# Patient Record
Sex: Female | Born: 1975 | Race: Black or African American | Hispanic: No | Marital: Single | Smoking: Never smoker
Health system: Southern US, Community
[De-identification: ages and names within clinical notes are randomized; demographics above are authoritative.]

## PROBLEM LIST (undated history)

## (undated) DIAGNOSIS — D219 Benign neoplasm of connective and other soft tissue, unspecified: Secondary | ICD-10-CM

## (undated) DIAGNOSIS — I10 Essential (primary) hypertension: Secondary | ICD-10-CM

## (undated) DIAGNOSIS — Z5189 Encounter for other specified aftercare: Secondary | ICD-10-CM

## (undated) DIAGNOSIS — A599 Trichomoniasis, unspecified: Secondary | ICD-10-CM

## (undated) HISTORY — DX: Encounter for other specified aftercare: Z51.89

## (undated) HISTORY — PX: EYE SURGERY: SHX253

## (undated) HISTORY — PX: WISDOM TOOTH EXTRACTION: SHX21

## (undated) HISTORY — DX: Benign neoplasm of connective and other soft tissue, unspecified: D21.9

## (undated) HISTORY — DX: Trichomoniasis, unspecified: A59.9

---

## 2014-05-13 DIAGNOSIS — Z5189 Encounter for other specified aftercare: Secondary | ICD-10-CM

## 2014-05-13 HISTORY — DX: Encounter for other specified aftercare: Z51.89

## 2014-05-24 HISTORY — PX: ABDOMINAL HYSTERECTOMY: SHX81

## 2014-07-25 ENCOUNTER — Ambulatory Visit (INDEPENDENT_AMBULATORY_CARE_PROVIDER_SITE_OTHER): Payer: 59 | Admitting: Physician Assistant

## 2014-07-25 VITALS — BP 150/86 | HR 86 | Temp 98.3°F | Resp 16 | Ht 64.0 in | Wt 171.2 lb

## 2014-07-25 DIAGNOSIS — D259 Leiomyoma of uterus, unspecified: Secondary | ICD-10-CM | POA: Diagnosis not present

## 2014-07-25 DIAGNOSIS — Z0000 Encounter for general adult medical examination without abnormal findings: Secondary | ICD-10-CM

## 2014-07-25 DIAGNOSIS — D219 Benign neoplasm of connective and other soft tissue, unspecified: Secondary | ICD-10-CM | POA: Insufficient documentation

## 2014-07-25 DIAGNOSIS — D251 Intramural leiomyoma of uterus: Secondary | ICD-10-CM | POA: Diagnosis not present

## 2014-07-25 NOTE — Progress Notes (Signed)
Subjective:    Patient ID: Janet Burnett, female    DOB: July 01, 1975, 39 y.o.   MRN: [id number]  Chief Complaint  Patient presents with  . Establish Care   Patient Active Problem List   Diagnosis Date Noted  . Fibroids 07/25/2014   Prior to Admission medications   Not on File   Medications, allergies, past medical history, surgical history, family history, social history and problem list reviewed and updated.  HPI  20 yof with pmh uterine fibroids presents for gyn referral.  Moved here from [facility] yesterday. She has family in [place] and will now be living in the area. She needs to establish care.   PMH - Uterine fibroids. Hx heavy menses. Had syncopal episode 12/15 attributed to anemia from fibroids. Was given 2 units. Has been following with gyn since the episode. Pt states she has been taking 2 iron pills day for past couple months. States gyn in [place] has bene following her anemia. She needs a referral to local gyn for possible hysterectomy. Has two daughters, age 65 and 42. They are still in [place].   Otherwise states she is healthy with no acute issues. Denies any aches, pains, complaints.   BP mildly elevated today. 150/86. Pt states she has hx mildly elevated bp at her appts.   Is not exercising or watching her diet as is too busy with recent move. Not currently working. Quit job in [employer] 6 wks ago, looking for work here now.   Review of Systems No cp, sob, fever, chills, ha, dizziness, lh, syncope, abd pain, n/v, diarrhea.     Objective:   Physical Exam  Constitutional: She is oriented to person, place, and time. She appears well-developed and well-nourished.  Non-toxic appearance. She does not have a sickly appearance. She does not appear ill. No distress.  BP 150/86 mmHg  Pulse 86  Temp(Src) 98.3 F (36.8 C) (Oral)  Resp 16  Ht 5\' 4"  (1.626 m)  Wt 171 lb 4 oz (77.678 kg)  BMI 29.38 kg/m2  SpO2 100%  LMP 07/23/2014   HENT:  Right Ear: Tympanic membrane normal.  Left Ear:  Tympanic membrane normal.  Nose: Nose normal.  Mouth/Throat: Uvula is midline and oropharynx is clear and moist.  Eyes:    Dark discoloration over lateral right cornea. Pt states stable for many yrs, she is unsure of the cause.   Cardiovascular: Normal rate, regular rhythm and normal heart sounds.  Exam reveals no gallop.   No murmur heard. Pulses:      Dorsalis pedis pulses are 2+ on the right side, and 2+ on the left side.       Posterior tibial pulses are 2+ on the right side, and 2+ on the left side.  Pulmonary/Chest: Effort normal and breath sounds normal. She has no decreased breath sounds. She has no wheezes. She has no rhonchi. She has no rales.  Neurological: She is alert and oriented to person, place, and time. She has normal strength. No cranial nerve deficit or sensory deficit.  Psychiatric: She has a normal mood and affect. Her speech is normal.      Assessment & Plan:   17 yof with pmh uterine fibroids presents for gyn referral.  Annual physical exam --not cpe today as pt was here to establish care in order to get gyn referral --states she has been seeing pcp and gyn in [place] and she is up to date on everything --we will plan to obtain records --likely cpe at  next visit --> poss labs, tdap, pap depending on what has been done in [facility] --Used to see: [facility] [employer] [facility] [place], [place]     Drs. Janet Burnett and Janet Burnett  Fibroids, intramural - Plan: Ambulatory referral to Gynecology Uterine leiomyoma, unspecified location --obtaining records from [facility] --referral to gyn for further management  Janet Gutting, PA-C Physician Assistant-Certified [facility]  07/25/2014 9:29 PM

## 2014-07-25 NOTE — Patient Instructions (Signed)
Your exam was normal today. We referred you to obgyn and they will be in contact with you for an appointment.  We'll try to get your records from [facility] so we know what you're up to date on moving forward.  Please let us know if we can do anything else for you.

## 2014-08-06 ENCOUNTER — Encounter: Payer: Self-pay | Admitting: Gynecology

## 2014-08-06 ENCOUNTER — Ambulatory Visit (INDEPENDENT_AMBULATORY_CARE_PROVIDER_SITE_OTHER): Payer: 59 | Admitting: Gynecology

## 2014-08-06 VITALS — BP 136/88 | Ht 63.5 in | Wt 173.0 lb

## 2014-08-06 DIAGNOSIS — D252 Subserosal leiomyoma of uterus: Secondary | ICD-10-CM

## 2014-08-06 DIAGNOSIS — D500 Iron deficiency anemia secondary to blood loss (chronic): Secondary | ICD-10-CM

## 2014-08-06 DIAGNOSIS — Z9289 Personal history of other medical treatment: Secondary | ICD-10-CM | POA: Diagnosis not present

## 2014-08-06 DIAGNOSIS — N946 Dysmenorrhea, unspecified: Secondary | ICD-10-CM

## 2014-08-06 DIAGNOSIS — D251 Intramural leiomyoma of uterus: Secondary | ICD-10-CM | POA: Diagnosis not present

## 2014-08-06 DIAGNOSIS — N920 Excessive and frequent menstruation with regular cycle: Secondary | ICD-10-CM

## 2014-08-06 LAB — CBC WITH DIFFERENTIAL/PLATELET
Basophils Absolute: 0.1 10*3/uL (ref 0.0–0.1)
Basophils Relative: 1 % (ref 0–1)
EOS PCT: 4 % (ref 0–5)
Eosinophils Absolute: 0.2 10*3/uL (ref 0.0–0.7)
HCT: 32.6 % — ABNORMAL LOW (ref 36.0–46.0)
Hemoglobin: 10.1 g/dL — ABNORMAL LOW (ref 12.0–15.0)
LYMPHS ABS: 1.8 10*3/uL (ref 0.7–4.0)
LYMPHS PCT: 31 % (ref 12–46)
MCH: 22.9 pg — AB (ref 26.0–34.0)
MCHC: 31 g/dL (ref 30.0–36.0)
MCV: 73.8 fL — AB (ref 78.0–100.0)
MONO ABS: 0.4 10*3/uL (ref 0.1–1.0)
MPV: 8.5 fL — ABNORMAL LOW (ref 8.6–12.4)
Monocytes Relative: 6 % (ref 3–12)
Neutro Abs: 3.4 10*3/uL (ref 1.7–7.7)
Neutrophils Relative %: 58 % (ref 43–77)
Platelets: 420 10*3/uL — ABNORMAL HIGH (ref 150–400)
RBC: 4.42 MIL/uL (ref 3.87–5.11)
RDW: 20.2 % — ABNORMAL HIGH (ref 11.5–15.5)
WBC: 5.9 10*3/uL (ref 4.0–10.5)

## 2014-08-06 LAB — HEPATITIS C ANTIBODY: HCV AB: NEGATIVE

## 2014-08-06 LAB — HEPATITIS B SURFACE ANTIGEN: HEP B S AG: NEGATIVE

## 2014-08-06 NOTE — Patient Instructions (Signed)
Abdominal Hysterectomy Abdominal hysterectomy is a surgical procedure to remove your womb (uterus). Your uterus is the muscular organ that contains a developing baby. This surgery is done for many reasons. You may need an abdominal hysterectomy if you have cancer, growths (tumors), long-term pain, or bleeding. You may also have this procedure if your uterus has slipped down into your vagina (uterine prolapse). Depending on why you need an abdominal hysterectomy, you may also have other reproductive organs removed. These could include the part of your vagina that connects with your uterus (cervix), the organs that make eggs (ovaries), and the tubes that connect the ovaries to the uterus (fallopian tubes). LET YOUR HEALTH CARE PROVIDER KNOW ABOUT:   Any allergies you have.  All medicines you are taking, including vitamins, herbs, eye drops, creams, and over-the-counter medicines.  Previous problems you or members of your family have had with the use of anesthetics.  Any blood disorders you have.  Previous surgeries you have had.  Medical conditions you have. RISKS AND COMPLICATIONS Generally, this is a safe procedure. However, as with any procedure, problems can occur. Infection is the most common problem after an abdominal hysterectomy. Other possible problems include:  Bleeding.  Formation of blood clots that may break free and travel to your lungs.  Injury to other organs near your uterus.  Nerve injury causing nerve pain.  Decreased interest in sex or pain during sexual intercourse. BEFORE THE PROCEDURE  Abdominal hysterectomy is a major surgical procedure. It can affect the way you feel about yourself. Talk to your health care provider about the physical and emotional changes hysterectomy may cause.  You may need to have blood work and X-rays done before surgery.  Quit smoking if you smoke. Ask your health care provider for help if you are struggling to quit.  Stop taking  medicines that thin your blood as directed by your health care provider.  You may be instructed to take antibiotic medicines or laxatives before surgery.  Do not eat or drink anything for 6-8 hours before surgery.  Take your regular medicines with a small sip of water.  Bathe or shower the night or morning before surgery. PROCEDURE  Abdominal hysterectomy is done in the operating room at the hospital.  In most cases, you will be given a medicine that makes you go to sleep (general anesthetic).  The surgeon will make a cut (incision) through the skin in your lower belly.  The incision may be about 5-7 inches long. It may go side-to-side or up-and-down.  The surgeon will move aside the body tissue that covers your uterus. The surgeon will then carefully take out your uterus along with any of your other reproductive organs that need to be removed.  Bleeding will be controlled with clamps or sutures.  The surgeon will close your incision with sutures or metal clips. AFTER THE PROCEDURE  You will have some pain immediately after the procedure.  You will be given pain medicine in the recovery room.  You will be taken to your hospital room when you have recovered from the anesthesia.  You may need to stay in the hospital for 2-5 days.  You will be given instructions for recovery at home. Document Released: 05/15/2013 Document Reviewed: 05/15/2013 ExitCare Patient Information 2015 [employer]. This information is not intended to replace advice given to you by your health care provider. Make sure you discuss any questions you have with your health care provider. Fibroids Fibroids are lumps (tumors) that can   occur any place in a woman's body. These lumps are not cancerous. Fibroids vary in size, weight, and where they grow. HOME CARE  Do not take aspirin.  Write down the number of pads or tampons you use during your period. Tell your doctor. This can help determine the best  treatment for you. GET HELP RIGHT AWAY IF:  You have pain in your lower belly (abdomen) that is not helped with medicine.  You have cramps that are not helped with medicine.  You have more bleeding between or during your period.  You feel lightheaded or pass out (faint).  Your lower belly pain gets worse. MAKE SURE YOU:  Understand these instructions.  Will watch your condition.  Will get help right away if you are not doing well or get worse. Document Released: 06/12/2010 Document Revised: 08/02/2011 Document Reviewed: 06/12/2010 ExitCare Patient Information 2015 [employer]. This information is not intended to replace advice given to you by your health care provider. Make sure you discuss any questions you have with your health care provider.  

## 2014-08-06 NOTE — Progress Notes (Signed)
Janet Burnett is an 39 y.o. female. Who is a new patient to the practice who has moved here from [place]. She is a gravida 4 para 2 (2 normal spontaneous vaginal deliveries and 2 elective terminations) with long-standing history of leiomyomatous uteri. She stated that her cycles are very heavy that last up to 10 days. She uses a lot of cramping bloating sensation and back pain. She is currently not sexually active. As a result of her heavy cycle she has had anemia to the point that in December she was hospitalized in [place] and was given IV estrogen as well as blood transfusion. She had brought with her the ultrasound report that was done in February this year with result as follows:  Uterus measured 13.4 x 10.4 x 7.1 cm. Endometrial stripe 0.8 cm in thickness. Multiple next echogenic masses throughout the uterus with several indenting the endometrial stripe. The largest mass recorded was lower uterine segment measuring 2.2 x 1.7 cm right uterine mass peripheral room with calcifications measuring 4.2 x 4.2 x 3.7 cm. Fibroids (menometrorrhagia 4.3 x 4.0 x 3.6 cm in calcified also. Exophytic mass in the left uterus measuring 2.6 x 2.2 x 2.8 cm. Mass on the right anterior uterus measured 5.5 x 5.4 x 4.5 cm. Posterior right uterine exophytic mass measuring 2.2 x 2.4 x 2.5 cm. As a result of the large fibroids the ovaries were not visualized.  Patient stated which she was 39 years of age she had Chlamydia or Trichomonas did not recall. She has not had her baseline mammogram yet. Patient with a normal Pap smear February this year report  Pertinent Gynecological History: Menses: see above Bleeding: See above Contraception: none DES exposure: denies Blood transfusions: Yes 2015 in [place] Sexually transmitted diseases: 39 years of age either had trichomoniasis or chlamydia could not recall Previous GYN Procedures: 2 normal spontaneous vaginal deliveries and 2 D&Cs for elective terminations  Last  mammogram: Has not had one done yet Date: Has not had one done yet  Last pap: normal Date: 2016 OB History: G 4, P 2A2   Menstrual History: Menarche age: 44 Patient's last menstrual period was 07/23/2014.    Past Medical History  Diagnosis Date  . Anemia   . Fibroid   . Blood transfusion without reported diagnosis 05/13/14    History reviewed. No pertinent past surgical history.  Family History  Problem Relation Age of Onset  . Diabetes Maternal Grandmother   . Hypertension Maternal Grandmother   . Breast cancer Paternal Grandmother     Social History:  reports that she has never smoked. She has never used smokeless tobacco. She reports that she drinks alcohol. She reports that she does not use illicit drugs.  Allergies: No Known Allergies   (Not in a hospital admission)  REVIEW OF SYSTEMS: A ROS was performed and pertinent positives and negatives are included in the history.  GENERAL: No fevers or chills. HEENT: No change in vision, no earache, sore throat or sinus congestion. NECK: No pain or stiffness. CARDIOVASCULAR: No chest pain or pressure. No palpitations. PULMONARY: No shortness of breath, cough or wheeze. GASTROINTESTINAL: No abdominal pain, nausea, vomiting or diarrhea, melena or bright red blood per rectum. GENITOURINARY: No urinary frequency, urgency, hesitancy or dysuria. MUSCULOSKELETAL: No joint or muscle pain, no back pain, no recent trauma. DERMATOLOGIC: No rash, no itching, no lesions. ENDOCRINE: No polyuria, polydipsia, no heat or cold intolerance. No recent change in weight. HEMATOLOGICAL: No anemia or easy bruising or bleeding. NEUROLOGIC:  No headache, seizures, numbness, tingling or weakness. PSYCHIATRIC: No depression, no loss of interest in normal activity or change in sleep pattern.     Blood pressure 136/88, height 5' 3.5" (1.613 m), weight 173 lb (78.472 kg), last menstrual period 07/23/2014.  Physical Exam:  HEENT:unremarkable Neck:Supple,  midline, no thyroid megaly, no carotid bruits Lungs:  Clear to auscultation no rhonchi's or wheezes Heart:Regular rate and rhythm, no murmurs or gallops Breast Exam: Symmetrical in appearance no palpable masses or tenderness no supra clavicular or axillary lymphadenopathy Abdomen: Slightly tender 14 week size fundal measurement of uterus Pelvic:BUS within normal limits Vagina: No lesions or discharge Cervix: No lesions or discharge Uterus: 14 week size irregular slightly tender Adnexa: No masses difficult to assess due to size of uterus Extremities: No cords, no edema Rectal: Unremarkable   Assessment/Plan: 40 year old gravida 4 para 2 Ab2 with worsening symptoms of leiomyomatous uteri continue to dysmenorrhea and menorrhagia. December 2015 patient had blood transfusion. For this reason we are going to check an HIV, hepatitis B and C and a CBC today. Patient's regular move forward schedule her surgery. Due to the size of her uterus were going to proceed with a total abdominal hysterectomy with bilateral salpingectomy and conservation of both ovaries. Literature information was provided today. Risk of surgery that was discussed as follows:                        Patient was counseled as to the risk of surgery to include the following:  1. Infection (prohylactic antibiotics will be administered)  2. DVT/Pulmonary Embolism (prophylactic pneumo compression stockings will be used)  3.Trauma to internal organs requiring additional surgical procedure to repair any injury to     Internal organs requiring perhaps additional hospitalization days.  4.Hemmorhage requiring transfusion and blood products which carry risks such as             anaphylactic reaction, hepatitis and AIDS  Patient had received literature information on the procedure scheduled and all her questions were answered and fully accepts all risk.   [facility] BLT90:30 PMTD@Note :     Terrance Mass 08/06/2014, 11:59  AM  Note: This dictation was prepared with  Dragon/digital dictation along withSmart phrase technology. Any transcriptional errors that result from this process are unintentional.

## 2014-08-07 ENCOUNTER — Other Ambulatory Visit: Payer: Self-pay | Admitting: Gynecology

## 2014-08-07 DIAGNOSIS — D500 Iron deficiency anemia secondary to blood loss (chronic): Secondary | ICD-10-CM

## 2014-08-07 LAB — HIV ANTIBODY (ROUTINE TESTING W REFLEX): HIV: NONREACTIVE

## 2014-08-09 ENCOUNTER — Ambulatory Visit: Payer: 59 | Admitting: Gynecology

## 2014-08-09 ENCOUNTER — Telehealth: Payer: Self-pay

## 2014-08-09 NOTE — Telephone Encounter (Signed)
I called [facility] and left a message that upcoming surgery for patient will require a referral from them and to call me sometime.

## 2014-08-27 ENCOUNTER — Encounter: Payer: Self-pay | Admitting: Gynecology

## 2014-09-13 NOTE — Patient Instructions (Signed)
   Your procedure is scheduled on:  Tuesday, May 3  Enter through the Main Entrance of [facility] at:  9:30 AM Pick up the phone at the desk and dial [phone] and inform us of your arrival.  Please call this number if you have any problems the morning of surgery: [phone]  Remember: Do not eat or drink after midnight: Monday Take these medicines the morning of surgery with a SIP OF WATER:  Do not wear jewelry, make-up, or FINGER nail polish No metal in your hair or on your body. Do not wear lotions, powders, perfumes.  You may wear deodorant.  Do not bring valuables to the hospital. Contacts, dentures or bridgework may not be worn into surgery.  Leave suitcase in the car. After Surgery it may be brought to your room. For patients being admitted to the hospital, checkout time is 11:00am the day of discharge.

## 2014-09-16 ENCOUNTER — Telehealth: Payer: Self-pay

## 2014-09-16 ENCOUNTER — Encounter (HOSPITAL_COMMUNITY)
Admission: RE | Admit: 2014-09-16 | Discharge: 2014-09-16 | Disposition: A | Discharge status: Home / Self Care | Payer: 59 | Source: Ambulatory Visit | Attending: Gynecology | Admitting: Gynecology

## 2014-09-16 ENCOUNTER — Other Ambulatory Visit: Payer: Self-pay

## 2014-09-16 ENCOUNTER — Inpatient Hospital Stay (HOSPITAL_COMMUNITY)
Admission: RE | Admit: 2014-09-16 | Discharge: 2014-09-16 | Disposition: A | Discharge status: Home / Self Care | Payer: Self-pay | Source: Ambulatory Visit

## 2014-09-16 ENCOUNTER — Encounter (HOSPITAL_COMMUNITY): Payer: Self-pay

## 2014-09-16 DIAGNOSIS — Z01812 Encounter for preprocedural laboratory examination: Secondary | ICD-10-CM | POA: Insufficient documentation

## 2014-09-16 LAB — CBC
HCT: 36.6 % (ref 36.0–46.0)
HEMOGLOBIN: 11.8 g/dL — AB (ref 12.0–15.0)
MCH: 26.6 pg (ref 26.0–34.0)
MCHC: 32.2 g/dL (ref 30.0–36.0)
MCV: 82.6 fL (ref 78.0–100.0)
Platelets: 337 10*3/uL (ref 150–400)
RBC: 4.43 MIL/uL (ref 3.87–5.11)
RDW: 21.2 % — ABNORMAL HIGH (ref 11.5–15.5)
WBC: 5.9 10*3/uL (ref 4.0–10.5)

## 2014-09-16 LAB — URINALYSIS, ROUTINE W REFLEX MICROSCOPIC
Bilirubin Urine: NEGATIVE
Glucose, UA: NEGATIVE mg/dL
Hgb urine dipstick: NEGATIVE
Ketones, ur: NEGATIVE mg/dL
Leukocytes, UA: NEGATIVE
NITRITE: NEGATIVE
Protein, ur: NEGATIVE mg/dL
SPECIFIC GRAVITY, URINE: 1.015 (ref 1.005–1.030)
UROBILINOGEN UA: 0.2 mg/dL (ref 0.0–1.0)
pH: 6 (ref 5.0–8.0)

## 2014-09-16 NOTE — Pre-Procedure Instructions (Signed)
Dr. Tresa Moore informed that patient's BP was elevated at today's PAT appt - 157/105 and  159/119.  Patient needs to be placed on BP med and diastolic BP needs to be below 100 in order for surgery on 09/24/14.  Juliann Pulse at Dr. Sandrea Hughs office informed [facility].  Will follow up with Juliann Pulse on Tuesday morning.  Patient informed diastolic BP needs to be below 100 for surgery on 09/24/14.

## 2014-09-16 NOTE — Telephone Encounter (Addendum)
Patient was seen today for pre-op at [facility].  Her BP was elevated. Taken at beginning of session it was 157/105 and at end of session it was 159/117.  She is scheduled for TAH, Bilat Salpingectomy next Tuesday 5/3.  Dr. Tresa Moore, anesthesiologist, asked if you would be willing to prescribed Blood Pressure medication for her. He suggested either Lisinopril 5 or 10mg  daily or Amilopidine 5 or 10mg  daily until surgery.  He said the diastolic will need to be lower that 100 to clear for surgery. Suggested you recheck her BP on Friday or Monday and if not below 100 surgery will have to be postponed.  Please let me know how you would like to proceed?

## 2014-09-16 NOTE — Patient Instructions (Addendum)
   Your procedure is scheduled on: Tuesday, May 3  Enter through the Main Entrance of [facility] at: 9:30 AM Pick up the phone at the desk and dial [phone] and inform us of your arrival.  Please call this number if you have any problems the morning of surgery: [phone]  Remember: Do not eat or drink after midnight: Monday Take these medicines the morning of surgery with a SIP OF WATER:  NONE  Do not wear jewelry, make-up, or FINGER nail polish No metal in your hair or on your body. Do not wear lotions, powders, perfumes.  You may wear deodorant.  Do not bring valuables to the hospital. Contacts, dentures or bridgework may not be worn into surgery.  Leave suitcase in the car. After Surgery it may be brought to your room. For patients being admitted to the hospital, checkout time is 11:00am the day of discharge.  Home with sister Kenney Houseman cell [phone]

## 2014-09-17 ENCOUNTER — Ambulatory Visit (INDEPENDENT_AMBULATORY_CARE_PROVIDER_SITE_OTHER): Payer: 59 | Admitting: Physician Assistant

## 2014-09-17 VITALS — BP 164/98 | HR 77 | Temp 98.0°F | Resp 16 | Ht 63.0 in | Wt 172.6 lb

## 2014-09-17 DIAGNOSIS — I10 Essential (primary) hypertension: Secondary | ICD-10-CM | POA: Diagnosis not present

## 2014-09-17 MED ORDER — AMLODIPINE BESYLATE 5 MG PO TABS: 5.0000 mg | ORAL_TABLET | Freq: Every day | ORAL | Status: DC

## 2014-09-17 NOTE — Telephone Encounter (Signed)
Have her see Brigitte Pulse her PCP she saw in March to get BP under control before surgery and for clearance. Make an appointment for her this week ASAP. Thanks!

## 2014-09-17 NOTE — Progress Notes (Signed)
   Subjective:    Patient ID: Janet Burnett, female    DOB: 10/11/1975, 39 y.o.   MRN: [id number]  Chief Complaint  Patient presents with  . Hypertension  . Surgical Clearance   Patient Active Problem List   Diagnosis Date Noted  . Essential hypertension 09/17/2014  . Anemia due to chronic blood loss 08/06/2014  . History of transfusion 08/06/2014  . Menorrhagia with regular cycle 08/06/2014  . Dysmenorrhea 08/06/2014  . Subserous leiomyoma of uterus 08/06/2014  . Intramural leiomyoma of uterus 08/06/2014  . Fibroids 07/25/2014   Medications, allergies, past medical history, surgical history, family history, social history and problem list reviewed and updated.  HPI  79 yof with pmh as above presents with elevated bp.   Initially saw her 3/3 for initial visit with clinic. BP was mildly elevated. Referred to gyn for fibroids. Saw Dr Toney Rakes with gyn 3/15, scheduled for hysterectomy on 09/24/14. Went to preop clinic yest morning bp was elevated 157/105, 159/117. They require DBP to be below 100 to proceed with surgery.   BP elevated in clinic today 164/98.   Denies cp, sob, palps, ha, vision changes.   Review of Systems See HPI.     Objective:   Physical Exam  Constitutional: She is oriented to person, place, and time. She appears well-developed and well-nourished.  Non-toxic appearance. She does not have a sickly appearance. She does not appear ill. No distress.  BP 164/98 mmHg  Pulse 77  Temp(Src) 98 F (36.7 C) (Oral)  Resp 16  Ht 5\' 3"  (1.6 m)  Wt 172 lb 9.6 oz (78.291 kg)  BMI 30.58 kg/m2  SpO2 89%  LMP 09/11/2014 (Approximate)   Neurological: She is alert and oriented to person, place, and time.  Psychiatric: She has a normal mood and affect. Her speech is normal and behavior is normal.      Assessment & Plan:   32 yof with pmh as above presents with elevated bp.   Essential hypertension - Plan: amLODipine (NORVASC) 5 MG tablet,  --elevated multiple  times recently --started amlodipine 5 mg qd, increase to 10 mg qd after one week if staying above 140/90 --rtc with dizziness, lh, syncope --hopefully able to proceed with TAH on 09/24/14  Julieta Gutting, PA-C Physician Assistant-Certified [facility]  09/17/2014 2:30 PM

## 2014-09-17 NOTE — Telephone Encounter (Signed)
Araceli Bouche, PA is at Urgent Care center so I can have her just go on over there to be seen.    Do you want me to reschedule her surgery for next Tuesday ?

## 2014-09-17 NOTE — Telephone Encounter (Signed)
My mistake. I would've preferred her to see her PCP after initiating the blood pressure medication to make sure that it was still clear for her surgery

## 2014-09-17 NOTE — Telephone Encounter (Signed)
It depends on starting her on blood pressure medication with follow-up perhaps the day before her surgery to make sure that her blood pressures are under control

## 2014-09-17 NOTE — Telephone Encounter (Signed)
I spoke with patient at 10:30am this morning. She said she will head right over to Urgent care now and see PA McVeigh.  She will come to the office Monday morning at 8:00am for BP check.  I scheduled her an 8am appt with you but was not sure you would actually want to see her?

## 2014-09-17 NOTE — Patient Instructions (Signed)
Please start taking the amlodipine 5 mg once daily. After one week if your blood pressure is still running above 140/90 please increase the dose to 10 mg once daily.   Hypertension Hypertension, commonly called high blood pressure, is when the force of blood pumping through your arteries is too strong. Your arteries are the blood vessels that carry blood from your heart throughout your body. A blood pressure reading consists of a higher number over a lower number, such as 110/72. The higher number (systolic) is the pressure inside your arteries when your heart pumps. The lower number (diastolic) is the pressure inside your arteries when your heart relaxes. Ideally you want your blood pressure below 120/80. Hypertension forces your heart to work harder to pump blood. Your arteries may become narrow or stiff. Having hypertension puts you at risk for heart disease, stroke, and other problems.  RISK FACTORS Some risk factors for high blood pressure are controllable. Others are not.  Risk factors you cannot control include:   Race. You may be at higher risk if you are African American.  Age. Risk increases with age.  Gender. Men are at higher risk than women before age 58 years. After age 65, women are at higher risk than men. Risk factors you can control include:  Not getting enough exercise or physical activity.  Being overweight.  Getting too much fat, sugar, calories, or salt in your diet.  Drinking too much alcohol. SIGNS AND SYMPTOMS Hypertension does not usually cause signs or symptoms. Extremely high blood pressure (hypertensive crisis) may cause headache, anxiety, shortness of breath, and nosebleed. DIAGNOSIS  To check if you have hypertension, your health care provider will measure your blood pressure while you are seated, with your arm held at the level of your heart. It should be measured at least twice using the same arm. Certain conditions can cause a difference in blood pressure  between your right and left arms. A blood pressure reading that is higher than normal on one occasion does not mean that you need treatment. If one blood pressure reading is high, ask your health care provider about having it checked again. TREATMENT  Treating high blood pressure includes making lifestyle changes and possibly taking medicine. Living a healthy lifestyle can help lower high blood pressure. You may need to change some of your habits. Lifestyle changes may include:  Following the DASH diet. This diet is high in fruits, vegetables, and whole grains. It is low in salt, red meat, and added sugars.  Getting at least 2 hours of brisk physical activity every week.  Losing weight if necessary.  Not smoking.  Limiting alcoholic beverages.  Learning ways to reduce stress. If lifestyle changes are not enough to get your blood pressure under control, your health care provider may prescribe medicine. You may need to take more than one. Work closely with your health care provider to understand the risks and benefits. HOME CARE INSTRUCTIONS  Have your blood pressure rechecked as directed by your health care provider.   Take medicines only as directed by your health care provider. Follow the directions carefully. Blood pressure medicines must be taken as prescribed. The medicine does not work as well when you skip doses. Skipping doses also puts you at risk for problems.   Do not smoke.   Monitor your blood pressure at home as directed by your health care provider. SEEK MEDICAL CARE IF:   You think you are having a reaction to medicines taken.  You have  recurrent headaches or feel dizzy.  You have swelling in your ankles.  You have trouble with your vision. SEEK IMMEDIATE MEDICAL CARE IF:  You develop a severe headache or confusion.  You have unusual weakness, numbness, or feel faint.  You have severe chest or abdominal pain.  You vomit repeatedly.  You have trouble  breathing. MAKE SURE YOU:   Understand these instructions.  Will watch your condition.  Will get help right away if you are not doing well or get worse. Document Released: 05/10/2005 Document Revised: 09/24/2013 Document Reviewed: 03/02/2013 [facility] Patient Information 2015 [place], [place]. This information is not intended to replace advice given to you by your health care provider. Make sure you discuss any questions you have with your health care provider.

## 2014-09-17 NOTE — Telephone Encounter (Signed)
I spoke back with patient and explained that Dr. Moshe Salisbury wanted PA McVeigh to recheck BP on Monday and clear her for surgery.  I told her no need to be here at 8am but stressed importance of her going there early that Monday so we can determine the status of her surgery early in the day in case we need to open our schedules for the providers for the following morning. She understood and agreed.

## 2014-09-19 ENCOUNTER — Telehealth: Payer: Self-pay

## 2014-09-19 NOTE — Telephone Encounter (Signed)
I spoke with Sheena at [facility] and cancelled prior authorization for patient's TAH upcoming on 09/24/14 as case has been cancelled.

## 2014-09-19 NOTE — Telephone Encounter (Signed)
Janet Burnett called patient this morning because she had not received her surgery pre-payment due by one week prior to surgery.  Patient is unable to pay this and can on pay $50.  Janet Burnett consulted with Janet Burnett and Janet Burnett and per that inquiry it was determined that this is not an urgent/emergent surgery and we should reschedule until patient is in a better placed financially to have surgery. Currently unemployed.  She was advised to continue taking her iron bid and to call us back to reschedule when she is ready financially.

## 2014-09-23 ENCOUNTER — Ambulatory Visit: Payer: Self-pay | Admitting: Gynecology

## 2014-09-24 ENCOUNTER — Inpatient Hospital Stay (HOSPITAL_COMMUNITY): Admission: RE | Admit: 2014-09-24 | Payer: 59 | Source: Ambulatory Visit | Admitting: Gynecology

## 2014-09-24 ENCOUNTER — Encounter (HOSPITAL_COMMUNITY): Admission: RE | Payer: Self-pay | Source: Ambulatory Visit

## 2014-09-24 SURGERY — HYSTERECTOMY, ABDOMINAL
Anesthesia: General

## 2014-10-28 ENCOUNTER — Encounter (HOSPITAL_COMMUNITY): Payer: Self-pay | Admitting: Family Medicine

## 2014-10-28 ENCOUNTER — Emergency Department (HOSPITAL_COMMUNITY)
Admission: EM | Admit: 2014-10-28 | Discharge: 2014-10-28 | Disposition: A | Discharge status: Home / Self Care | Payer: 59 | Attending: Emergency Medicine | Admitting: Emergency Medicine

## 2014-10-28 DIAGNOSIS — D259 Leiomyoma of uterus, unspecified: Secondary | ICD-10-CM | POA: Insufficient documentation

## 2014-10-28 DIAGNOSIS — R5383 Other fatigue: Secondary | ICD-10-CM | POA: Diagnosis not present

## 2014-10-28 DIAGNOSIS — R1030 Lower abdominal pain, unspecified: Secondary | ICD-10-CM | POA: Diagnosis present

## 2014-10-28 DIAGNOSIS — D649 Anemia, unspecified: Secondary | ICD-10-CM | POA: Insufficient documentation

## 2014-10-28 DIAGNOSIS — Z3202 Encounter for pregnancy test, result negative: Secondary | ICD-10-CM | POA: Insufficient documentation

## 2014-10-28 DIAGNOSIS — Z79899 Other long term (current) drug therapy: Secondary | ICD-10-CM | POA: Diagnosis not present

## 2014-10-28 DIAGNOSIS — R102 Pelvic and perineal pain: Secondary | ICD-10-CM

## 2014-10-28 DIAGNOSIS — G8929 Other chronic pain: Secondary | ICD-10-CM | POA: Diagnosis not present

## 2014-10-28 LAB — COMPREHENSIVE METABOLIC PANEL
ALBUMIN: 3.7 g/dL (ref 3.5–5.0)
ALT: 13 U/L — AB (ref 14–54)
ANION GAP: 8 (ref 5–15)
AST: 17 U/L (ref 15–41)
Alkaline Phosphatase: 60 U/L (ref 38–126)
BUN: 8 mg/dL (ref 6–20)
CALCIUM: 9.5 mg/dL (ref 8.9–10.3)
CHLORIDE: 104 mmol/L (ref 101–111)
CO2: 27 mmol/L (ref 22–32)
CREATININE: 0.96 mg/dL (ref 0.44–1.00)
GFR calc non Af Amer: >60 mL/min (ref >60)
GLUCOSE: 89 mg/dL (ref 65–99)
Potassium: 3.6 mmol/L (ref 3.5–5.1)
SODIUM: 139 mmol/L (ref 135–145)
Total Bilirubin: 0.3 mg/dL (ref 0.3–1.2)
Total Protein: 7 g/dL (ref 6.5–8.1)

## 2014-10-28 LAB — CBC WITH DIFFERENTIAL/PLATELET
Basophils Absolute: 0.1 10*3/uL (ref 0.0–0.1)
Basophils Relative: 1 % (ref 0–1)
EOS ABS: 0.4 10*3/uL (ref 0.0–0.7)
EOS PCT: 6 % — AB (ref 0–5)
HCT: 39.9 % (ref 36.0–46.0)
Hemoglobin: 13.1 g/dL (ref 12.0–15.0)
LYMPHS ABS: 1.5 10*3/uL (ref 0.7–4.0)
Lymphocytes Relative: 24 % (ref 12–46)
MCH: 27.2 pg (ref 26.0–34.0)
MCHC: 32.8 g/dL (ref 30.0–36.0)
MCV: 83 fL (ref 78.0–100.0)
MONO ABS: 0.5 10*3/uL (ref 0.1–1.0)
MONOS PCT: 8 % (ref 3–12)
Neutro Abs: 3.6 10*3/uL (ref 1.7–7.7)
Neutrophils Relative %: 61 % (ref 43–77)
Platelets: 304 10*3/uL (ref 150–400)
RBC: 4.81 MIL/uL (ref 3.87–5.11)
RDW: 18.1 % — ABNORMAL HIGH (ref 11.5–15.5)
WBC: 6 10*3/uL (ref 4.0–10.5)

## 2014-10-28 LAB — URINALYSIS, ROUTINE W REFLEX MICROSCOPIC
BILIRUBIN URINE: NEGATIVE
GLUCOSE, UA: NEGATIVE mg/dL
Hgb urine dipstick: NEGATIVE
Ketones, ur: NEGATIVE mg/dL
LEUKOCYTES UA: NEGATIVE
NITRITE: NEGATIVE
PROTEIN: NEGATIVE mg/dL
Specific Gravity, Urine: 1.029 (ref 1.005–1.030)
Urobilinogen, UA: 0.2 mg/dL (ref 0.0–1.0)
pH: 5 (ref 5.0–8.0)

## 2014-10-28 LAB — PREGNANCY, URINE: Preg Test, Ur: NEGATIVE

## 2014-10-28 MED ORDER — MEGESTROL ACETATE 40 MG PO TABS: 40.0000 mg | ORAL_TABLET | Freq: Two times a day (BID) | ORAL | Status: DC

## 2014-10-28 MED ORDER — HYDROCODONE-ACETAMINOPHEN 5-325 MG PO TABS: 1.0000 | ORAL_TABLET | Freq: Four times a day (QID) | ORAL | Status: DC | PRN

## 2014-10-28 NOTE — ED Provider Notes (Signed)
CSN: [id number]     Arrival date & time 10/28/14  1448 History  This chart was scribed for Debroah Baller, NP working with Fredia Sorrow, MD by Randa Evens, ED Scribe. This patient was seen in room TR05C/TR05C and the patient's care was started at 3:52 PM.      Chief Complaint  Patient presents with  . Abdominal Pain    Patient is a 39 y.o. female presenting with abdominal pain. The history is provided by the patient. No language interpreter was used.  Abdominal Pain Pain location:  Suprapubic Pain radiates to:  Does not radiate Pain severity:  Mild Onset quality:  Gradual Chronicity:  Chronic Relieved by:  Nothing  HPI Comments: Janet Burnett is a 39 y.o. female with PMHx fibroids, anemia, blood transfusions who presents to the Emergency Department complaining of chronic abdominal pain onset 4 months prior. Pt report associated fatigue and vaginal bleeding. Pt states she has been compliant with taking iron for 4 months. Pt states that she has irregular menstrual cycles with increased vaginal bleeding. Pt states that she has to change pads and tampons several times a day. She states that she is changing pads or tampons about every 2 hours. Pt states that the fibroids have caused her uterus to the be the size of someone who is 4 month pregnant. Pt states that she was schedule to have a hysterectomy but was not able to follow up due to the initial visit costing too much. LMP 09/30/2014.   Pain is not real bad now however, it is time for her to start her period and that is when the pain and bleeding is so bad.   Past Medical History  Diagnosis Date  . Anemia   . Fibroid   . Blood transfusion without reported diagnosis 05/13/14    In [place], 2 units transfused   . SVD (spontaneous vaginal delivery)     x 2   Past Surgical History  Procedure Laterality Date  . Wisdom tooth extraction    . 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 Diabetes Maternal Grandmother   . Hypertension Maternal Grandmother   . Breast cancer Paternal Grandmother    History  Substance Use Topics  . Smoking status: Never Smoker   . Smokeless tobacco: Never Used  . Alcohol Use: 0.0 oz/week    0 Standard drinks or equivalent per week     Comment: WINE OCC   OB History    Gravida Para Term Preterm AB TAB SAB Ectopic Multiple Living   4 2   2     2       Review of Systems  Gastrointestinal: Positive for abdominal pain.  All other systems reviewed and are negative.   Allergies  Review of patient's allergies indicates no known allergies.  Home Medications   Prior to Admission medications   Medication Sig Start Date End Date Taking? Authorizing Provider  amLODipine (NORVASC) 5 MG tablet Take 1 tablet (5 mg total) by mouth daily. 09/17/14  Yes Todd McVeigh, PA  CRANBERRY PO Take 1 tablet by mouth daily.   Yes Historical Provider, MD  Ferrous Sulfate (IRON) 325 (65 FE) MG TABS Take 2 tablets by mouth daily.   Yes Historical Provider, MD  ibuprofen (ADVIL,MOTRIN) 200 MG tablet Take 800 mg by mouth every 6 (six) hours as needed for mild pain or cramping.    Yes Historical  Provider, MD  Multiple Vitamin (MULTIVITAMIN WITH MINERALS) TABS tablet Take 1 tablet by mouth daily.   Yes Historical Provider, MD  Omega-3 Fatty Acids (FISH OIL) 1000 MG CAPS Take 1 capsule by mouth daily.   Yes Historical Provider, MD  vitamin B-12 (CYANOCOBALAMIN) 1000 MCG tablet Take 1,000 mcg by mouth daily.   Yes Historical Provider, MD  HYDROcodone-acetaminophen (NORCO) 5-325 MG per tablet Take 1 tablet by mouth every 6 (six) hours as needed for moderate pain. 10/28/14   Kerri Asche Bunnie Pion, NP  megestrol (MEGACE) 40 MG tablet Take 1 tablet (40 mg total) by mouth 2 (two) times daily. 10/28/14   Loura Pitt Bunnie Pion, NP   BP 143/84 mmHg  Pulse 72  Temp(Src) 98.5 F (36.9 C) (Oral)  Resp 16  SpO2 100%  LMP 10/06/2014   Physical Exam  Constitutional: She is oriented to person,  place, and time. She appears well-developed and well-nourished. No distress.  HENT:  Head: Normocephalic and atraumatic.  Eyes: Conjunctivae and EOM are normal.  Neck: Neck supple. No tracheal deviation present.  Cardiovascular: Normal rate.   Pulmonary/Chest: Effort normal. No respiratory distress.  Abdominal: Soft. There is no tenderness.  Musculoskeletal: Normal range of motion.  Neurological: She is alert and oriented to person, place, and time.  Skin: Skin is warm and dry.  Psychiatric: She has a normal mood and affect. Her behavior is normal.  Nursing note and vitals reviewed.   ED Course  Procedures (including critical care time) DIAGNOSTIC STUDIES: Oxygen Saturation is 98% on RA, normal by my interpretation.    COORDINATION OF CARE: 4:55 PM-Discussed treatment plan with pt at bedside and pt agreed to plan.   Results for orders placed or performed during the hospital encounter of 10/28/14 (from the past 24 hour(s))  Urinalysis, Routine w reflex microscopic (not at [facility])     Status: Abnormal   Collection Time: 10/28/14  3:57 PM  Result Value Ref Range   Color, Urine YELLOW YELLOW   APPearance CLOUDY (A) CLEAR   Specific Gravity, Urine 1.029 1.005 - 1.030   pH 5.0 5.0 - 8.0   Glucose, UA NEGATIVE NEGATIVE mg/dL   Hgb urine dipstick NEGATIVE NEGATIVE   Bilirubin Urine NEGATIVE NEGATIVE   Ketones, ur NEGATIVE NEGATIVE mg/dL   Protein, ur NEGATIVE NEGATIVE mg/dL   Urobilinogen, UA 0.2 0.0 - 1.0 mg/dL   Nitrite NEGATIVE NEGATIVE   Leukocytes, UA NEGATIVE NEGATIVE  CBC with Differential     Status: Abnormal   Collection Time: 10/28/14  4:14 PM  Result Value Ref Range   WBC 6.0 4.0 - 10.5 K/uL   RBC 4.81 3.87 - 5.11 MIL/uL   Hemoglobin 13.1 12.0 - 15.0 g/dL   HCT 39.9 36.0 - 46.0 %   MCV 83.0 78.0 - 100.0 fL   MCH 27.2 26.0 - 34.0 pg   MCHC 32.8 30.0 - 36.0 g/dL   RDW 18.1 (H) 11.5 - 15.5 %   Platelets 304 150 - 400 K/uL   Neutrophils Relative % 61 43 - 77 %    Neutro Abs 3.6 1.7 - 7.7 K/uL   Lymphocytes Relative 24 12 - 46 %   Lymphs Abs 1.5 0.7 - 4.0 K/uL   Monocytes Relative 8 3 - 12 %   Monocytes Absolute 0.5 0.1 - 1.0 K/uL   Eosinophils Relative 6 (H) 0 - 5 %   Eosinophils Absolute 0.4 0.0 - 0.7 K/uL   Basophils Relative 1 0 - 1 %   Basophils Absolute  0.1 0.0 - 0.1 K/uL  Comprehensive metabolic panel     Status: Abnormal   Collection Time: 10/28/14  4:14 PM  Result Value Ref Range   Sodium 139 135 - 145 mmol/L   Potassium 3.6 3.5 - 5.1 mmol/L   Chloride 104 101 - 111 mmol/L   CO2 27 22 - 32 mmol/L   Glucose, Bld 89 65 - 99 mg/dL   BUN 8 6 - 20 mg/dL   Creatinine, Ser 0.96 0.44 - 1.00 mg/dL   Calcium 9.5 8.9 - 10.3 mg/dL   Total Protein 7.0 6.5 - 8.1 g/dL   Albumin 3.7 3.5 - 5.0 g/dL   AST 17 15 - 41 U/L   ALT 13 (L) 14 - 54 U/L   Alkaline Phosphatase 60 38 - 126 U/L   Total Bilirubin 0.3 0.3 - 1.2 mg/dL   GFR calc non Af Amer >60 >60 mL/min   GFR calc Af Amer >60 >60 mL/min   Anion gap 8 5 - 15   Consult with Dr. Ihor Dow (GYN) on call at [facility]. Agrees with plan to start Megace and pain management. Patient to follow up with Dr. Toney Rakes or GYN of choice to try and reschedule hysterectomy.   Case Manager Consult and will help patient with getting Financial Aid needed to go forward with her surgery.   MDM  39 y.o. female with chronic abdominal pain due to fibroids. Her for pain management and referral for hysterectomy. Stable for d/c with normal CBC and minimal pain at this time. Discussed with the patient and all questioned fully answered. She will call me if any problems arise.   Final diagnoses:  Uterine leiomyoma, unspecified location  Pelvic pain in female    I personally performed the services described in this documentation, which was scribed in my presence. The recorded information has been reviewed and is accurate.      [place], NP 10/28/14 Harper, MD 10/30/14 [phone]

## 2014-10-28 NOTE — Discharge Instructions (Signed)
Abdominal Pain, Women °Abdominal (stomach, pelvic, or belly) pain can be caused by many things. It is important to tell your doctor: °· The location of the pain. °· Does it come and go or is it present all the time? °· Are there things that start the pain (eating certain foods, exercise)? °· Are there other symptoms associated with the pain (fever, nausea, vomiting, diarrhea)? °All of this is helpful to know when trying to find the cause of the pain. °CAUSES  °· Stomach: virus or bacteria infection, or ulcer. °· Intestine: appendicitis (inflamed appendix), regional ileitis (Crohn's disease), ulcerative colitis (inflamed colon), irritable bowel syndrome, diverticulitis (inflamed diverticulum of the colon), or cancer of the stomach or intestine. °· Gallbladder disease or stones in the gallbladder. °· Kidney disease, kidney stones, or infection. °· Pancreas infection or cancer. °· Fibromyalgia (pain disorder). °· Diseases of the female organs: °¨ Uterus: fibroid (non-cancerous) tumors or infection. °¨ Fallopian tubes: infection or tubal pregnancy. °¨ Ovary: cysts or tumors. °¨ Pelvic adhesions (scar tissue). °¨ Endometriosis (uterus lining tissue growing in the pelvis and on the pelvic organs). °¨ Pelvic congestion syndrome (female organs filling up with blood just before the menstrual period). °¨ Pain with the menstrual period. °¨ Pain with ovulation (producing an egg). °¨ Pain with an IUD (intrauterine device, birth control) in the uterus. °¨ Cancer of the female organs. °· Functional pain (pain not caused by a disease, may improve without treatment). °· Psychological pain. °· Depression. °DIAGNOSIS  °Your doctor will decide the seriousness of your pain by doing an examination. °· Blood tests. °· X-rays. °· Ultrasound. °· CT scan (computed tomography, special type of X-ray). °· MRI (magnetic resonance imaging). °· Cultures, for infection. °· Barium enema (dye inserted in the large intestine, to better view it with  X-rays). °· Colonoscopy (looking in intestine with a lighted tube). °· Laparoscopy (minor surgery, looking in abdomen with a lighted tube). °· Major abdominal exploratory surgery (looking in abdomen with a large incision). °TREATMENT  °The treatment will depend on the cause of the pain.  °· Many cases can be observed and treated at home. °· Over-the-counter medicines recommended by your caregiver. °· Prescription medicine. °· Antibiotics, for infection. °· Birth control pills, for painful periods or for ovulation pain. °· Hormone treatment, for endometriosis. °· Nerve blocking injections. °· Physical therapy. °· Antidepressants. °· Counseling with a psychologist or psychiatrist. °· Minor or major surgery. °HOME CARE INSTRUCTIONS  °· Do not take laxatives, unless directed by your caregiver. °· Take over-the-counter pain medicine only if ordered by your caregiver. Do not take aspirin because it can cause an upset stomach or bleeding. °· Try a clear liquid diet (broth or water) as ordered by your caregiver. Slowly move to a bland diet, as tolerated, if the pain is related to the stomach or intestine. °· Have a thermometer and take your temperature several times a day, and record it. °· Bed rest and sleep, if it helps the pain. °· Avoid sexual intercourse, if it causes pain. °· Avoid stressful situations. °· Keep your follow-up appointments and tests, as your caregiver orders. °· If the pain does not go away with medicine or surgery, you may try: °¨ Acupuncture. °¨ Relaxation exercises (yoga, meditation). °¨ Group therapy. °¨ Counseling. °SEEK MEDICAL CARE IF:  °· You notice certain foods cause stomach pain. °· Your home care treatment is not helping your pain. °· You need stronger pain medicine. °· You want your IUD removed. °· You feel faint or   lightheaded. °· You develop nausea and vomiting. °· You develop a rash. °· You are having side effects or an allergy to your medicine. °SEEK IMMEDIATE MEDICAL CARE IF:  °· Your  pain does not go away or gets worse. °· You have a fever. °· Your pain is felt only in portions of the abdomen. The right side could possibly be appendicitis. The left lower portion of the abdomen could be colitis or diverticulitis. °· You are passing blood in your stools (bright red or black tarry stools, with or without vomiting). °· You have blood in your urine. °· You develop chills, with or without a fever. °· You pass out. °MAKE SURE YOU:  °· Understand these instructions. °· Will watch your condition. °· Will get help right away if you are not doing well or get worse. °Document Released: 03/07/2007 Document Revised: 09/24/2013 Document Reviewed: 03/27/2009 °[employer]® Patient Information ©2015 [employer]. This information is not intended to replace advice given to you by your health care provider. Make sure you discuss any questions you have with your health care provider. ° °

## 2014-10-28 NOTE — ED Notes (Signed)
Pt here for abd pain due to her fibroids. sts she needs to have surgery but unable to pay. sts she is weak.

## 2014-10-28 NOTE — Care Management (Signed)
ED CM consulted by Laurence Aly on FT regarding patient needing assistance with GYN procedure. Met with patient at bedside she states, she was seeing Dr. Hebert Soho in the past, but states she is no longer able to see him. She no longer is working and does not have Copy. Patient is requesting assistance with finding another GYN. Discussed the [facility] at [facility] with patient. Patient agreeable with plan for f/u at the women's clinic. Provided patient with information to contact iinfromation for the [facility], patient consented to CM forwarding information to [facility] scheduler as well. Updated Luellen Pucker RN and [place] NP on FT. No further ED CM needs identified.

## 2014-10-28 NOTE — ED Notes (Signed)
Declined W/C at D/C and was escorted to lobby by RN. 

## 2014-11-04 ENCOUNTER — Encounter: Payer: 59 | Admitting: Obstetrics & Gynecology

## 2014-11-27 ENCOUNTER — Ambulatory Visit (INDEPENDENT_AMBULATORY_CARE_PROVIDER_SITE_OTHER): Payer: Self-pay | Admitting: Family Medicine

## 2014-11-27 ENCOUNTER — Encounter: Payer: Self-pay | Admitting: Family Medicine

## 2014-11-27 VITALS — BP 168/102 | HR 76 | Ht 66.0 in | Wt 178.8 lb

## 2014-11-27 DIAGNOSIS — D259 Leiomyoma of uterus, unspecified: Secondary | ICD-10-CM

## 2014-11-27 NOTE — Patient Instructions (Signed)
Hysterectomy Information  A hysterectomy is a surgery in which your uterus is removed. This surgery may be done to treat various medical problems. After the surgery, you will no longer have menstrual periods. The surgery will also make you unable to become pregnant (sterile). The fallopian tubes and ovaries can be removed (bilateral salpingo-oophorectomy) during this surgery as well.  REASONS FOR A HYSTERECTOMY  Persistent, abnormal bleeding.  Lasting (chronic) pelvic pain or infection.  The lining of the uterus (endometrium) starts growing outside the uterus (endometriosis).  The endometrium starts growing in the muscle of the uterus (adenomyosis).  The uterus falls down into the vagina (pelvic organ prolapse).  Noncancerous growths in the uterus (uterine fibroids) that cause symptoms.  Precancerous cells.  Cervical cancer or uterine cancer. TYPES OF HYSTERECTOMIES  Supracervical hysterectomy--In this type, the top part of the uterus is removed, but not the cervix.  Total hysterectomy--The uterus and cervix are removed.  Radical hysterectomy--The uterus, the cervix, and the fibrous tissue that holds the uterus in place in the pelvis (parametrium) are removed. WAYS A HYSTERECTOMY CAN BE PERFORMED  Abdominal hysterectomy--A large surgical cut (incision) is made in the abdomen. The uterus is removed through this incision.  Vaginal hysterectomy--An incision is made in the vagina. The uterus is removed through this incision. There are no abdominal incisions.  Conventional laparoscopic hysterectomy--Three or four small incisions are made in the abdomen. A thin, lighted tube with a camera (laparoscope) is inserted into one of the incisions. Other tools are put through the other incisions. The uterus is cut into small pieces. The small pieces are removed through the incisions, or they are removed through the vagina.  Laparoscopically assisted vaginal hysterectomy (LAVH)--Three or four  small incisions are made in the abdomen. Part of the surgery is performed laparoscopically and part vaginally. The uterus is removed through the vagina.  Robot-assisted laparoscopic hysterectomy--A laparoscope and other tools are inserted into 3 or 4 small incisions in the abdomen. A computer-controlled device is used to give the surgeon a 3D image and to help control the surgical instruments. This allows for more precise movements of surgical instruments. The uterus is cut into small pieces and removed through the incisions or removed through the vagina. RISKS AND COMPLICATIONS  Possible complications associated with this procedure include:  Bleeding and risk of blood transfusion. Tell your health care provider if you do not want to receive any blood products.  Blood clots in the legs or lung.  Infection.  Injury to surrounding organs.  Problems or side effects related to anesthesia.  Conversion to an abdominal hysterectomy from one of the other techniques. WHAT TO EXPECT AFTER A HYSTERECTOMY  You will be given pain medicine.  You will need to have someone with you for the first 3-5 days after you go home.  You will need to follow up with your surgeon in 2-4 weeks after surgery to evaluate your progress.  You may have early menopause symptoms such as hot flashes, night sweats, and insomnia.  If you had a hysterectomy for a problem that was not cancer or not a condition that could lead to cancer, then you no longer need Pap tests. However, even if you no longer need a Pap test, a regular exam is a good idea to make sure no other problems are starting. Document Released: 11/03/2000 Document Revised: 02/28/2013 Document Reviewed: 01/15/2013 [facility] Patient Information 2015 [place], [place]. This information is not intended to replace advice given to you by your health care  provider. Make sure you discuss any questions you have with your health care provider. DASH Eating Plan DASH stands  for "Dietary Approaches to Stop Hypertension." The DASH eating plan is a healthy eating plan that has been shown to reduce high blood pressure (hypertension). Additional health benefits may include reducing the risk of type 2 diabetes mellitus, heart disease, and stroke. The DASH eating plan may also help with weight loss. WHAT DO I NEED TO KNOW ABOUT THE DASH EATING PLAN? For the DASH eating plan, you will follow these general guidelines:  Choose foods with a percent daily value for sodium of less than 5% (as listed on the food label).  Use salt-free seasonings or herbs instead of table salt or sea salt.  Check with your health care provider or pharmacist before using salt substitutes.  Eat lower-sodium products, often labeled as "lower sodium" or "no salt added."  Eat fresh foods.  Eat more vegetables, fruits, and low-fat dairy products.  Choose whole grains. Look for the word "whole" as the first word in the ingredient list.  Choose fish and skinless chicken or [place] more often than red meat. Limit fish, poultry, and meat to 6 oz (170 g) each day.  Limit sweets, desserts, sugars, and sugary drinks.  Choose heart-healthy fats.  Limit cheese to 1 oz (28 g) per day.  Eat more home-cooked food and less restaurant, buffet, and fast food.  Limit fried foods.  Cook foods using methods other than frying.  Limit canned vegetables. If you do use them, rinse them well to decrease the sodium.  When eating at a restaurant, ask that your food be prepared with less salt, or no salt if possible. WHAT FOODS CAN I EAT? Seek help from a dietitian for individual calorie needs. Grains Whole grain or whole wheat bread. Brown rice. Whole grain or whole wheat pasta. Quinoa, bulgur, and whole grain cereals. Low-sodium cereals. Corn or whole wheat flour tortillas. Whole grain cornbread. Whole grain crackers. Low-sodium crackers. Vegetables Fresh or frozen vegetables (raw, steamed, roasted, or  grilled). Low-sodium or reduced-sodium tomato and vegetable juices. Low-sodium or reduced-sodium tomato sauce and paste. Low-sodium or reduced-sodium canned vegetables.  Fruits All fresh, canned (in natural juice), or frozen fruits. Meat and Other Protein Products Ground beef (85% or leaner), grass-fed beef, or beef trimmed of fat. Skinless chicken or [place]. Ground chicken or [place]. Pork trimmed of fat. All fish and seafood. Eggs. Dried beans, peas, or lentils. Unsalted nuts and seeds. Unsalted canned beans. Dairy Low-fat dairy products, such as skim or 1% milk, 2% or reduced-fat cheeses, low-fat ricotta or cottage cheese, or plain low-fat yogurt. Low-sodium or reduced-sodium cheeses. Fats and Oils Tub margarines without trans fats. Light or reduced-fat mayonnaise and salad dressings (reduced sodium). Avocado. Safflower, olive, or canola oils. Natural peanut or almond butter. Other Unsalted popcorn and pretzels. The items listed above may not be a complete list of recommended foods or beverages. Contact your dietitian for more options. WHAT FOODS ARE NOT RECOMMENDED? Grains White bread. White pasta. White rice. Refined cornbread. Bagels and croissants. Crackers that contain trans fat. Vegetables Creamed or fried vegetables. Vegetables in a cheese sauce. Regular canned vegetables. Regular canned tomato sauce and paste. Regular tomato and vegetable juices. Fruits Dried fruits. Canned fruit in light or heavy syrup. Fruit juice. Meat and Other Protein Products Fatty cuts of meat. Ribs, chicken wings, bacon, sausage, bologna, salami, chitterlings, fatback, hot dogs, bratwurst, and packaged luncheon meats. Salted nuts and seeds. Canned beans with salt.  Dairy Whole or 2% milk, cream, half-and-half, and cream cheese. Whole-fat or sweetened yogurt. Full-fat cheeses or blue cheese. Nondairy creamers and whipped toppings. Processed cheese, cheese spreads, or cheese curds. Condiments Onion and garlic  salt, seasoned salt, table salt, and sea salt. Canned and packaged gravies. Worcestershire sauce. Tartar sauce. Barbecue sauce. Teriyaki sauce. Soy sauce, including reduced sodium. Steak sauce. Fish sauce. Oyster sauce. Cocktail sauce. Horseradish. Ketchup and mustard. Meat flavorings and tenderizers. Bouillon cubes. Hot sauce. Tabasco sauce. Marinades. Taco seasonings. Relishes. Fats and Oils Butter, stick margarine, lard, shortening, ghee, and bacon fat. Coconut, palm kernel, or palm oils. Regular salad dressings. Other Pickles and olives. Salted popcorn and pretzels. The items listed above may not be a complete list of foods and beverages to avoid. Contact your dietitian for more information. WHERE CAN I FIND MORE INFORMATION? National [employer], [employer], [employer] Institute: travelstabloid.com Document Released: 04/29/2011 Document Revised: 09/24/2013 Document Reviewed: 03/14/2013 [facility] Patient Information 2015 [place], [place]. This information is not intended to replace advice given to you by your health care provider. Make sure you discuss any questions you have with your health care provider.

## 2014-11-27 NOTE — Progress Notes (Signed)
    Subjective:    Patient ID: Janet Burnett is a 39 y.o. female presenting with surgical consult  on 11/27/2014  HPI: Has h/o fibroid uterus. Has reportedly having EMB and pap in [place] in 12/15. Had to be admitted for blood transfusion. Reports abnormal bleeding and pain.  U/s reviewed and shows 13 wk size uterus. Had work-up done and was seen by Dr. Toney Rakes who scheduled her for TAH but she could not afford.  Review of Systems  Constitutional: Negative for fever and chills.  Respiratory: Negative for shortness of breath.   Cardiovascular: Negative for chest pain.  Gastrointestinal: Negative for nausea, vomiting and abdominal pain.  Genitourinary: Negative for dysuria.  Skin: Negative for rash.      Objective:    BP 168/102 mmHg  Pulse 76  Ht 5\' 6"  (1.676 m)  Wt 178 lb 12.8 oz (81.103 kg)  BMI 28.87 kg/m2  LMP 10/28/2014 Physical Exam  Constitutional: She is oriented to person, place, and time. She appears well-developed and well-nourished. No distress.  HENT:  Head: Normocephalic and atraumatic.  Eyes: No scleral icterus.  Neck: Neck supple.  Cardiovascular: Normal rate.   Pulmonary/Chest: Effort normal.  Abdominal: Soft.  Neurological: She is alert and oriented to person, place, and time.  Skin: Skin is warm and dry.  Psychiatric: She has a normal mood and affect.   U/s images from [place] reviewed.  Uterus is 13.4 cm. Largest fibroid is 5 cm     Assessment & Plan:   Problem List Items Addressed This Visit      Unprioritized   Fibroids - Primary    For TVH and bilateral salpingectomy.         Return in about 2 weeks (around 12/11/2014).  PRATT,TANYA S 11/27/2014 4:39 PM

## 2014-11-27 NOTE — Progress Notes (Deleted)
    Subjective:    Patient ID: Janet Burnett is a 39 y.o. female presenting with surgical consult  on 11/27/2014  HPI: ***  Review of Systems    Objective:    BP 168/102 mmHg  Pulse 76  Ht 5\' 6"  (1.676 m)  Wt 178 lb 12.8 oz (81.103 kg)  BMI 28.87 kg/m2  LMP 10/28/2014 Physical Exam      Assessment & Plan:    No Follow-up on file.  Aikeem Lilley S 11/27/2014 4:38 PM

## 2014-11-27 NOTE — Assessment & Plan Note (Signed)
For [place] and bilateral salpingectomy.

## 2014-12-04 ENCOUNTER — Encounter (HOSPITAL_COMMUNITY): Payer: Self-pay | Admitting: *Deleted

## 2014-12-10 ENCOUNTER — Telehealth: Payer: Self-pay | Admitting: *Deleted

## 2014-12-10 NOTE — Telephone Encounter (Addendum)
Pt left message requesting a letter to be faxed to [employer]. She has been receiving food stamp assistance and in order to have them renewed, they need to know why she cannot work. Pt further stated that she is waiting for a hysterectomy (scheduled on 8/16). She requests that the letter state the procedure she is scheduled to have performed (TVH and BSO) and that she cannot work before the procedure. Her case worker is Steffanie Rainwater, fax #  [phone].  **Will need to confirm with Dr. Kennon Rounds that pt is unable to work and pt will need to sign ROI before any such letter can be faxed. She has next clinic appt on 7/25 @ 1445.  7/27  Pt kept appt as scheduled on 7/25 w/Dr. Kennon Rounds and was given letter as requested.

## 2014-12-16 ENCOUNTER — Ambulatory Visit (INDEPENDENT_AMBULATORY_CARE_PROVIDER_SITE_OTHER): Payer: Self-pay | Admitting: Family Medicine

## 2014-12-16 ENCOUNTER — Encounter: Payer: Self-pay | Admitting: Family Medicine

## 2014-12-16 VITALS — BP 170/113 | HR 83 | Temp 98.6°F | Ht 66.0 in | Wt 176.7 lb

## 2014-12-16 DIAGNOSIS — I10 Essential (primary) hypertension: Secondary | ICD-10-CM

## 2014-12-16 DIAGNOSIS — D259 Leiomyoma of uterus, unspecified: Secondary | ICD-10-CM

## 2014-12-16 DIAGNOSIS — N920 Excessive and frequent menstruation with regular cycle: Secondary | ICD-10-CM

## 2014-12-16 NOTE — Assessment & Plan Note (Signed)
Continue megace to stop her cycle.

## 2014-12-16 NOTE — Patient Instructions (Signed)
Hysterectomy Information  A hysterectomy is a surgery in which your uterus is removed. This surgery may be done to treat various medical problems. After the surgery, you will no longer have menstrual periods. The surgery will also make you unable to become pregnant (sterile). The fallopian tubes and ovaries can be removed (bilateral salpingo-oophorectomy) during this surgery as well.  REASONS FOR A HYSTERECTOMY  Persistent, abnormal bleeding.  Lasting (chronic) pelvic pain or infection.  The lining of the uterus (endometrium) starts growing outside the uterus (endometriosis).  The endometrium starts growing in the muscle of the uterus (adenomyosis).  The uterus falls down into the vagina (pelvic organ prolapse).  Noncancerous growths in the uterus (uterine fibroids) that cause symptoms.  Precancerous cells.  Cervical cancer or uterine cancer. TYPES OF HYSTERECTOMIES  Supracervical hysterectomy--In this type, the top part of the uterus is removed, but not the cervix.  Total hysterectomy--The uterus and cervix are removed.  Radical hysterectomy--The uterus, the cervix, and the fibrous tissue that holds the uterus in place in the pelvis (parametrium) are removed. WAYS A HYSTERECTOMY CAN BE PERFORMED  Abdominal hysterectomy--A large surgical cut (incision) is made in the abdomen. The uterus is removed through this incision.  Vaginal hysterectomy--An incision is made in the vagina. The uterus is removed through this incision. There are no abdominal incisions.  Conventional laparoscopic hysterectomy--Three or four small incisions are made in the abdomen. A thin, lighted tube with a camera (laparoscope) is inserted into one of the incisions. Other tools are put through the other incisions. The uterus is cut into small pieces. The small pieces are removed through the incisions, or they are removed through the vagina.  Laparoscopically assisted vaginal hysterectomy (LAVH)--Three or four  small incisions are made in the abdomen. Part of the surgery is performed laparoscopically and part vaginally. The uterus is removed through the vagina.  Robot-assisted laparoscopic hysterectomy--A laparoscope and other tools are inserted into 3 or 4 small incisions in the abdomen. A computer-controlled device is used to give the surgeon a 3D image and to help control the surgical instruments. This allows for more precise movements of surgical instruments. The uterus is cut into small pieces and removed through the incisions or removed through the vagina. RISKS AND COMPLICATIONS  Possible complications associated with this procedure include:  Bleeding and risk of blood transfusion. Tell your health care provider if you do not want to receive any blood products.  Blood clots in the legs or lung.  Infection.  Injury to surrounding organs.  Problems or side effects related to anesthesia.  Conversion to an abdominal hysterectomy from one of the other techniques. WHAT TO EXPECT AFTER A HYSTERECTOMY  You will be given pain medicine.  You will need to have someone with you for the first 3-5 days after you go home.  You will need to follow up with your surgeon in 2-4 weeks after surgery to evaluate your progress.  You may have early menopause symptoms such as hot flashes, night sweats, and insomnia.  If you had a hysterectomy for a problem that was not cancer or not a condition that could lead to cancer, then you no longer need Pap tests. However, even if you no longer need a Pap test, a regular exam is a good idea to make sure no other problems are starting. Document Released: 11/03/2000 Document Revised: 02/28/2013 Document Reviewed: 01/15/2013 [facility] Patient Information 2015 [place], [place]. This information is not intended to replace advice given to you by your health care  provider. Make sure you discuss any questions you have with your health care provider. Hypertension Hypertension,  commonly called high blood pressure, is when the force of blood pumping through your arteries is too strong. Your arteries are the blood vessels that carry blood from your heart throughout your body. A blood pressure reading consists of a higher number over a lower number, such as 110/72. The higher number (systolic) is the pressure inside your arteries when your heart pumps. The lower number (diastolic) is the pressure inside your arteries when your heart relaxes. Ideally you want your blood pressure below 120/80. Hypertension forces your heart to work harder to pump blood. Your arteries may become narrow or stiff. Having hypertension puts you at risk for heart disease, stroke, and other problems.  RISK FACTORS Some risk factors for high blood pressure are controllable. Others are not.  Risk factors you cannot control include:   Race. You may be at higher risk if you are African American.  Age. Risk increases with age.  Gender. Men are at higher risk than women before age 48 years. After age 56, women are at higher risk than men. Risk factors you can control include:  Not getting enough exercise or physical activity.  Being overweight.  Getting too much fat, sugar, calories, or salt in your diet.  Drinking too much alcohol. SIGNS AND SYMPTOMS Hypertension does not usually cause signs or symptoms. Extremely high blood pressure (hypertensive crisis) may cause headache, anxiety, shortness of breath, and nosebleed. DIAGNOSIS  To check if you have hypertension, your health care provider will measure your blood pressure while you are seated, with your arm held at the level of your heart. It should be measured at least twice using the same arm. Certain conditions can cause a difference in blood pressure between your right and left arms. A blood pressure reading that is higher than normal on one occasion does not mean that you need treatment. If one blood pressure reading is high, ask your health  care provider about having it checked again. TREATMENT  Treating high blood pressure includes making lifestyle changes and possibly taking medicine. Living a healthy lifestyle can help lower high blood pressure. You may need to change some of your habits. Lifestyle changes may include:  Following the DASH diet. This diet is high in fruits, vegetables, and whole grains. It is low in salt, red meat, and added sugars.  Getting at least 2 hours of brisk physical activity every week.  Losing weight if necessary.  Not smoking.  Limiting alcoholic beverages.  Learning ways to reduce stress. If lifestyle changes are not enough to get your blood pressure under control, your health care provider may prescribe medicine. You may need to take more than one. Work closely with your health care provider to understand the risks and benefits. HOME CARE INSTRUCTIONS  Have your blood pressure rechecked as directed by your health care provider.   Take medicines only as directed by your health care provider. Follow the directions carefully. Blood pressure medicines must be taken as prescribed. The medicine does not work as well when you skip doses. Skipping doses also puts you at risk for problems.   Do not smoke.   Monitor your blood pressure at home as directed by your health care provider. SEEK MEDICAL CARE IF:   You think you are having a reaction to medicines taken.  You have recurrent headaches or feel dizzy.  You have swelling in your ankles.  You have trouble with  your vision. SEEK IMMEDIATE MEDICAL CARE IF:  You develop a severe headache or confusion.  You have unusual weakness, numbness, or feel faint.  You have severe chest or abdominal pain.  You vomit repeatedly.  You have trouble breathing. MAKE SURE YOU:   Understand these instructions.  Will watch your condition.  Will get help right away if you are not doing well or get worse. Document Released: 05/10/2005  Document Revised: 09/24/2013 Document Reviewed: 03/02/2013 [facility] Patient Information 2015 [place], [place]. This information is not intended to replace advice given to you by your health care provider. Make sure you discuss any questions you have with your health care provider.

## 2014-12-16 NOTE — Progress Notes (Signed)
    Subjective:    Patient ID: Janet Burnett is a 39 y.o. female presenting with Follow-up  on 12/16/2014  HPI: She is here for f/u. She has not been taking her Megace. She has not been taking her blood pressure medication. She is on a diet and taking cayenne pepper. Reports that her last menstrual cycle was very heavy. She has not been taking her Megace, since a nurse told her it was usually prescribed for the elderly.  Review of Systems  Constitutional: Negative for fever and chills.  Respiratory: Negative for shortness of breath.   Cardiovascular: Negative for chest pain.  Gastrointestinal: Negative for nausea, vomiting and abdominal pain.  Genitourinary: Negative for dysuria.  Skin: Negative for rash.      Objective:    BP 170/113 mmHg  Pulse 83  Temp(Src) 98.6 F (37 C) (Oral)  Ht 5\' 6"  (1.676 m)  Wt 176 lb 11.2 oz (80.151 kg)  BMI 28.53 kg/m2  LMP 12/09/2014 Physical Exam  Constitutional: She is oriented to person, place, and time. She appears well-developed and well-nourished. No distress.  HENT:  Head: Normocephalic and atraumatic.  Eyes: No scleral icterus.  Neck: Neck supple.  Cardiovascular: Normal rate.   Pulmonary/Chest: Effort normal.  Abdominal: Soft.  Neurological: She is alert and oriented to person, place, and time.  Skin: Skin is warm and dry.  Psychiatric: She has a normal mood and affect.        Assessment & Plan:   Problem List Items Addressed This Visit      Unprioritized   Fibroids    Still for TVH      Menorrhagia with regular cycle - Primary    Continue megace to stop her cycle.      Essential hypertension    Needs to begin Norvasc and start taking or her surgery will be canceled.        Advised pt. That her surgery will be canceled if her BP is not controlled.  Return in about 3 months (around 03/18/2015).  Exzavier Ruderman S 12/16/2014 3:44 PM

## 2014-12-16 NOTE — Assessment & Plan Note (Signed)
Still for [facility]

## 2014-12-16 NOTE — Assessment & Plan Note (Signed)
Needs to begin Norvasc and start taking or her surgery will be canceled.

## 2014-12-18 ENCOUNTER — Encounter: Payer: Self-pay | Admitting: *Deleted

## 2014-12-19 NOTE — H&P (Signed)
  Janet Burnett is an 39 y.o. [id number] Unknown female.   Chief Complaint: pelvic pain and bleeding HPI: Has h/o fibroid uterus. Has reportedly having EMB and pap in [place] in 12/15. Had to be admitted for blood transfusion. Reports abnormal bleeding and pain. U/s reviewed and shows 13 wk size uterus. Had work-up done and was seen by Dr. Toney Rakes who scheduled her for TAH but she could not afford.  Past Medical History  Diagnosis Date  . Anemia   . Fibroid   . Blood transfusion without reported diagnosis 05/13/14    In [place], 2 units transfused   . SVD (spontaneous vaginal delivery)     x 2    Past Surgical History  Procedure Laterality Date  . Wisdom tooth extraction    . 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 Diabetes Maternal Grandmother   . Hypertension Maternal Grandmother   . Breast cancer Paternal Grandmother    Social History:  reports that she has never smoked. She has never used smokeless tobacco. She reports that she drinks alcohol. She reports that she does not use illicit drugs.  Allergies: No Known Allergies  No current facility-administered medications on file prior to encounter.   Current Outpatient Prescriptions on File Prior to Encounter  Medication Sig Dispense Refill  . Ferrous Sulfate (IRON) 325 (65 FE) MG TABS Take 2 tablets by mouth daily.    Marland Kitchen ibuprofen (ADVIL,MOTRIN) 200 MG tablet Take 800 mg by mouth every 6 (six) hours as needed for mild pain or cramping.     . Multiple Vitamin (MULTIVITAMIN WITH MINERALS) TABS tablet Take 1 tablet by mouth daily.    . Omega-3 Fatty Acids (FISH OIL) 1000 MG CAPS Take 1 capsule by mouth daily.      A comprehensive review of systems was negative.  There were no vitals taken for this visit. General appearance: alert, cooperative and appears stated age Head: Normocephalic, without obvious abnormality, atraumatic Neck: supple, symmetrical, trachea  midline Lungs: normal effort Heart: regular rate and rhythm Abdomen: soft, non-tender; bowel sounds normal; no masses,  no organomegaly Extremities: extremities normal, atraumatic, no cyanosis or edema Skin: Skin color, texture, turgor normal. No rashes or lesions Neurologic: Grossly normal   Lab Results  Component Value Date   WBC 6.0 10/28/2014   HGB 13.1 10/28/2014   HCT 39.9 10/28/2014   MCV 83.0 10/28/2014   PLT 304 10/28/2014   Lab Results  Component Value Date   PREGTESTUR NEGATIVE 10/28/2014     Assessment/Plan Principal Problem:   Menorrhagia with regular cycle Active Problems:   Fibroids   Anemia due to chronic blood loss   Dysmenorrhea  For TVH and salpingectomy. Risks include but are not limited to bleeding, infection, injury to surrounding structures, including bowel, bladder and ureters, blood clots, and death.  Likelihood of success is high.   Ayub Kirsh S 12/19/2014, 8:05 PM

## 2014-12-25 ENCOUNTER — Encounter (HOSPITAL_COMMUNITY)
Admission: RE | Admit: 2014-12-25 | Discharge: 2014-12-25 | Disposition: A | Discharge status: Home / Self Care | Payer: Self-pay | Source: Ambulatory Visit | Attending: Family Medicine | Admitting: Family Medicine

## 2014-12-25 ENCOUNTER — Encounter (HOSPITAL_COMMUNITY): Payer: Self-pay

## 2014-12-25 ENCOUNTER — Other Ambulatory Visit: Payer: Self-pay

## 2014-12-25 DIAGNOSIS — D259 Leiomyoma of uterus, unspecified: Secondary | ICD-10-CM | POA: Insufficient documentation

## 2014-12-25 DIAGNOSIS — Z01818 Encounter for other preprocedural examination: Secondary | ICD-10-CM | POA: Insufficient documentation

## 2014-12-25 HISTORY — DX: Essential (primary) hypertension: I10

## 2014-12-25 LAB — BASIC METABOLIC PANEL
ANION GAP: 3 — AB (ref 5–15)
BUN: 10 mg/dL (ref 6–20)
CALCIUM: 9.1 mg/dL (ref 8.9–10.3)
CHLORIDE: 106 mmol/L (ref 101–111)
CO2: 25 mmol/L (ref 22–32)
Creatinine, Ser: 0.85 mg/dL (ref 0.44–1.00)
GFR calc Af Amer: >60 mL/min (ref >60)
GFR calc non Af Amer: >60 mL/min (ref >60)
GLUCOSE: 92 mg/dL (ref 65–99)
POTASSIUM: 3.2 mmol/L — AB (ref 3.5–5.1)
SODIUM: 134 mmol/L — AB (ref 135–145)

## 2014-12-25 LAB — CBC
HEMATOCRIT: 40.7 % (ref 36.0–46.0)
Hemoglobin: 13.7 g/dL (ref 12.0–15.0)
MCH: 28.8 pg (ref 26.0–34.0)
MCHC: 33.7 g/dL (ref 30.0–36.0)
MCV: 85.5 fL (ref 78.0–100.0)
PLATELETS: 330 10*3/uL (ref 150–400)
RBC: 4.76 MIL/uL (ref 3.87–5.11)
RDW: 14.6 % (ref 11.5–15.5)
WBC: 6.7 10*3/uL (ref 4.0–10.5)

## 2014-12-25 NOTE — Pre-Procedure Instructions (Signed)
Pt's K+ result is low but not low enough to alert anesthesia. I called pt and advised she eat high K+ foods. Also, sent email to Dr. Kennon Rounds to alert her that K+ is low.

## 2014-12-25 NOTE — Patient Instructions (Addendum)
Your procedure is scheduled on:01/07/15  Enter through the Main Entrance at :0830 am Pick up desk phone and dial [phone] and inform us of your arrival.  Please call [phone] if you have any problems the morning of surgery.  Remember: Do not eat food after midnight:Monday Clear liquids are ok until:6am on Tuesday   You may brush your teeth the morning of surgery.  Take these meds the morning of surgery with a sip of water:BP med  DO NOT wear jewelry, eye make-up, lipstick,body lotion, or dark fingernail polish.  (Polished toes are ok) You may wear deodorant.  If you are to be admitted after surgery, leave suitcase in car until your room has been assigned. Patients discharged on the day of surgery will not be allowed to drive home. Wear loose fitting, comfortable clothes for your ride home.

## 2014-12-26 ENCOUNTER — Other Ambulatory Visit: Payer: Self-pay | Admitting: Family Medicine

## 2014-12-26 MED ORDER — POTASSIUM CHLORIDE ER 10 MEQ PO TBCR: 40.0000 meq | EXTENDED_RELEASE_TABLET | Freq: Two times a day (BID) | ORAL | Status: DC

## 2014-12-31 ENCOUNTER — Telehealth: Payer: Self-pay | Admitting: General Practice

## 2014-12-31 DIAGNOSIS — E876 Hypokalemia: Secondary | ICD-10-CM

## 2014-12-31 MED ORDER — POTASSIUM CHLORIDE ER 10 MEQ PO TBCR: 20.0000 meq | EXTENDED_RELEASE_TABLET | Freq: Two times a day (BID) | ORAL | Status: DC

## 2014-12-31 NOTE — Telephone Encounter (Signed)
Per Dr Kennon Rounds, patient needs Rx for k-dur 46mEq po BID till surgery on 8/16, serum potassium was very low. Very important patient take as prescribed and consume foods high in potassium. Called patient, no answer. Left message stating we are trying to reach with results and in regards to a prescription, please call us back at the clinics once you receive this message.

## 2014-12-31 NOTE — Telephone Encounter (Signed)
Patient called back to front office. Informed patient of lab results and Rx at pharmacy. Discussed importance of taking medication as prescribed as well as eating foods high in potassium such as dark leafy greens, potatoes, yogurt or bananas. Patient verbalized understanding and states she will get the Rx today. Patient had no questions

## 2015-01-01 ENCOUNTER — Telehealth: Payer: Self-pay | Admitting: *Deleted

## 2015-01-01 NOTE — Telephone Encounter (Signed)
Contacted patient, informed patient that we have a fax from [facility] and we are unable to complete and fax with a signed ROI. Pt states she will be by the clinic later today or in the am 8/11.

## 2015-01-07 ENCOUNTER — Encounter (HOSPITAL_COMMUNITY)
Admission: RE | Disposition: A | Discharge status: Home / Self Care | Payer: Self-pay | Source: Ambulatory Visit | Attending: Family Medicine

## 2015-01-07 ENCOUNTER — Encounter (HOSPITAL_COMMUNITY): Payer: Self-pay | Admitting: Registered Nurse

## 2015-01-07 ENCOUNTER — Ambulatory Visit (HOSPITAL_COMMUNITY)
Admission: RE | Admit: 2015-01-07 | Discharge: 2015-01-08 | Disposition: A | Discharge status: Home / Self Care | Payer: Self-pay | Source: Ambulatory Visit | Attending: Family Medicine | Admitting: Family Medicine

## 2015-01-07 ENCOUNTER — Ambulatory Visit (HOSPITAL_COMMUNITY): Payer: Self-pay | Admitting: Anesthesiology

## 2015-01-07 DIAGNOSIS — D219 Benign neoplasm of connective and other soft tissue, unspecified: Secondary | ICD-10-CM | POA: Diagnosis present

## 2015-01-07 DIAGNOSIS — N72 Inflammatory disease of cervix uteri: Secondary | ICD-10-CM | POA: Insufficient documentation

## 2015-01-07 DIAGNOSIS — R102 Pelvic and perineal pain: Secondary | ICD-10-CM | POA: Insufficient documentation

## 2015-01-07 DIAGNOSIS — Z79899 Other long term (current) drug therapy: Secondary | ICD-10-CM | POA: Insufficient documentation

## 2015-01-07 DIAGNOSIS — N946 Dysmenorrhea, unspecified: Secondary | ICD-10-CM | POA: Diagnosis present

## 2015-01-07 DIAGNOSIS — I10 Essential (primary) hypertension: Secondary | ICD-10-CM | POA: Insufficient documentation

## 2015-01-07 DIAGNOSIS — N920 Excessive and frequent menstruation with regular cycle: Secondary | ICD-10-CM | POA: Diagnosis present

## 2015-01-07 DIAGNOSIS — D251 Intramural leiomyoma of uterus: Secondary | ICD-10-CM | POA: Insufficient documentation

## 2015-01-07 DIAGNOSIS — D500 Iron deficiency anemia secondary to blood loss (chronic): Secondary | ICD-10-CM | POA: Diagnosis present

## 2015-01-07 DIAGNOSIS — D259 Leiomyoma of uterus, unspecified: Secondary | ICD-10-CM

## 2015-01-07 HISTORY — PX: VAGINAL HYSTERECTOMY: SHX2639

## 2015-01-07 HISTORY — PX: BILATERAL SALPINGECTOMY: SHX5743

## 2015-01-07 LAB — PREGNANCY, URINE: PREG TEST UR: NEGATIVE

## 2015-01-07 SURGERY — HYSTERECTOMY, VAGINAL
Anesthesia: General | Site: Vagina

## 2015-01-07 MED ORDER — GLYCOPYRROLATE 0.2 MG/ML IJ SOLN
INTRAMUSCULAR | Status: DC | PRN
  Administered 2015-01-07: 0.6 mg via INTRAVENOUS

## 2015-01-07 MED ORDER — SODIUM CHLORIDE 0.9 % IJ SOLN: 9.0000 mL | INTRAMUSCULAR | Status: DC | PRN

## 2015-01-07 MED ORDER — ROCURONIUM BROMIDE 100 MG/10ML IV SOLN
INTRAVENOUS | Status: DC | PRN
  Administered 2015-01-07: 60 mg via INTRAVENOUS

## 2015-01-07 MED ORDER — ROCURONIUM BROMIDE 100 MG/10ML IV SOLN
INTRAVENOUS | Status: AC
Start: 2015-01-07 — End: 2015-01-07
  Filled 2015-01-07: qty 1

## 2015-01-07 MED ORDER — LIDOCAINE-EPINEPHRINE 1 %-1:100000 IJ SOLN
INTRAMUSCULAR | Status: AC
  Filled 2015-01-07: qty 1

## 2015-01-07 MED ORDER — HYDROMORPHONE HCL 1 MG/ML IJ SOLN
INTRAMUSCULAR | Status: AC
  Administered 2015-01-07: 0.25 mg via INTRAVENOUS
  Filled 2015-01-07: qty 1

## 2015-01-07 MED ORDER — DEXAMETHASONE SODIUM PHOSPHATE 4 MG/ML IJ SOLN
INTRAMUSCULAR | Status: AC
  Filled 2015-01-07: qty 1

## 2015-01-07 MED ORDER — ONDANSETRON HCL 4 MG/2ML IJ SOLN
4.0000 mg | Freq: Four times a day (QID) | INTRAMUSCULAR | Status: DC | PRN
  Administered 2015-01-07 (×2): 4 mg via INTRAVENOUS
  Filled 2015-01-07 (×2): qty 2

## 2015-01-07 MED ORDER — LIDOCAINE HCL (CARDIAC) 20 MG/ML IV SOLN
INTRAVENOUS | Status: DC | PRN
  Administered 2015-01-07: 50 mg via INTRAVENOUS

## 2015-01-07 MED ORDER — IBUPROFEN 600 MG PO TABS: 600.0000 mg | ORAL_TABLET | Freq: Four times a day (QID) | ORAL | Status: DC | PRN

## 2015-01-07 MED ORDER — MIDAZOLAM HCL 5 MG/5ML IJ SOLN
INTRAMUSCULAR | Status: DC | PRN
  Administered 2015-01-07: 2 mg via INTRAVENOUS

## 2015-01-07 MED ORDER — HYDROMORPHONE HCL 1 MG/ML IJ SOLN
0.2500 mg | INTRAMUSCULAR | Status: DC | PRN
  Administered 2015-01-07 (×2): 0.25 mg via INTRAVENOUS

## 2015-01-07 MED ORDER — KETOROLAC TROMETHAMINE 30 MG/ML IJ SOLN: 30.0000 mg | Freq: Four times a day (QID) | INTRAMUSCULAR | Status: DC

## 2015-01-07 MED ORDER — DIPHENHYDRAMINE HCL 50 MG/ML IJ SOLN: 12.5000 mg | Freq: Four times a day (QID) | INTRAMUSCULAR | Status: DC | PRN

## 2015-01-07 MED ORDER — ESTRADIOL 0.1 MG/GM VA CREA
TOPICAL_CREAM | VAGINAL | Status: AC
  Filled 2015-01-07: qty 42.5

## 2015-01-07 MED ORDER — LACTATED RINGERS IV SOLN: INTRAVENOUS | Status: DC

## 2015-01-07 MED ORDER — HYDROMORPHONE HCL 1 MG/ML IJ SOLN
INTRAMUSCULAR | Status: DC | PRN
  Administered 2015-01-07: 1 mg via INTRAVENOUS

## 2015-01-07 MED ORDER — DEXAMETHASONE SODIUM PHOSPHATE 4 MG/ML IJ SOLN
INTRAMUSCULAR | Status: DC | PRN
  Administered 2015-01-07: 4 mg via INTRAVENOUS

## 2015-01-07 MED ORDER — ONDANSETRON HCL 4 MG/2ML IJ SOLN
INTRAMUSCULAR | Status: AC
  Filled 2015-01-07: qty 2

## 2015-01-07 MED ORDER — CEFAZOLIN SODIUM-DEXTROSE 2-3 GM-% IV SOLR
2.0000 g | INTRAVENOUS | Status: AC
  Administered 2015-01-07: 2 g via INTRAVENOUS

## 2015-01-07 MED ORDER — AMLODIPINE BESYLATE 5 MG PO TABS
5.0000 mg | ORAL_TABLET | Freq: Every day | ORAL | Status: DC
  Administered 2015-01-08: 5 mg via ORAL
  Filled 2015-01-07 (×2): qty 1

## 2015-01-07 MED ORDER — MENTHOL 3 MG MT LOZG: 1.0000 | LOZENGE | OROMUCOSAL | Status: DC | PRN

## 2015-01-07 MED ORDER — ESTRADIOL 0.1 MG/GM VA CREA
TOPICAL_CREAM | VAGINAL | Status: DC | PRN
  Administered 2015-01-07: 1 via VAGINAL

## 2015-01-07 MED ORDER — DEXTROSE IN LACTATED RINGERS 5 % IV SOLN
INTRAVENOUS | Status: DC
  Administered 2015-01-07 – 2015-01-08 (×2): via INTRAVENOUS

## 2015-01-07 MED ORDER — PROPOFOL 10 MG/ML IV BOLUS
INTRAVENOUS | Status: AC
  Filled 2015-01-07: qty 20

## 2015-01-07 MED ORDER — LABETALOL HCL 5 MG/ML IV SOLN
INTRAVENOUS | Status: DC | PRN
  Administered 2015-01-07 (×4): 5 mg via INTRAVENOUS

## 2015-01-07 MED ORDER — FENTANYL CITRATE (PF) 100 MCG/2ML IJ SOLN
INTRAMUSCULAR | Status: DC | PRN
  Administered 2015-01-07: 150 ug via INTRAVENOUS
  Administered 2015-01-07 (×2): 50 ug via INTRAVENOUS

## 2015-01-07 MED ORDER — ONDANSETRON HCL 4 MG/2ML IJ SOLN
INTRAMUSCULAR | Status: DC | PRN
  Administered 2015-01-07: 4 mg via INTRAVENOUS

## 2015-01-07 MED ORDER — KETOROLAC TROMETHAMINE 30 MG/ML IJ SOLN: 30.0000 mg | Freq: Once | INTRAMUSCULAR | Status: DC

## 2015-01-07 MED ORDER — OXYCODONE-ACETAMINOPHEN 5-325 MG PO TABS: 1.0000 | ORAL_TABLET | Freq: Four times a day (QID) | ORAL | Status: DC | PRN

## 2015-01-07 MED ORDER — NEOSTIGMINE METHYLSULFATE 10 MG/10ML IV SOLN
INTRAVENOUS | Status: AC
  Filled 2015-01-07: qty 1

## 2015-01-07 MED ORDER — SCOPOLAMINE 1 MG/3DAYS TD PT72
MEDICATED_PATCH | TRANSDERMAL | Status: AC
  Administered 2015-01-07: 1.5 mg via TRANSDERMAL
  Filled 2015-01-07: qty 1

## 2015-01-07 MED ORDER — PROPOFOL 10 MG/ML IV BOLUS
INTRAVENOUS | Status: DC | PRN
  Administered 2015-01-07: 200 mg via INTRAVENOUS

## 2015-01-07 MED ORDER — DIPHENHYDRAMINE HCL 12.5 MG/5ML PO ELIX: 12.5000 mg | ORAL_SOLUTION | Freq: Four times a day (QID) | ORAL | Status: DC | PRN

## 2015-01-07 MED ORDER — GLYCOPYRROLATE 0.2 MG/ML IJ SOLN
INTRAMUSCULAR | Status: AC
  Filled 2015-01-07: qty 2

## 2015-01-07 MED ORDER — HYDROMORPHONE HCL 1 MG/ML IJ SOLN
INTRAMUSCULAR | Status: AC
  Filled 2015-01-07: qty 1

## 2015-01-07 MED ORDER — KETOROLAC TROMETHAMINE 30 MG/ML IJ SOLN
30.0000 mg | Freq: Four times a day (QID) | INTRAMUSCULAR | Status: DC
  Administered 2015-01-07 – 2015-01-08 (×3): 30 mg via INTRAVENOUS
  Filled 2015-01-07 (×3): qty 1

## 2015-01-07 MED ORDER — CEFAZOLIN SODIUM-DEXTROSE 2-3 GM-% IV SOLR
INTRAVENOUS | Status: AC
  Filled 2015-01-07: qty 50

## 2015-01-07 MED ORDER — ONDANSETRON HCL 4 MG/2ML IJ SOLN: 4.0000 mg | Freq: Once | INTRAMUSCULAR | Status: DC | PRN

## 2015-01-07 MED ORDER — GLYCOPYRROLATE 0.2 MG/ML IJ SOLN
INTRAMUSCULAR | Status: AC
  Filled 2015-01-07: qty 1

## 2015-01-07 MED ORDER — KETOROLAC TROMETHAMINE 30 MG/ML IJ SOLN
INTRAMUSCULAR | Status: AC
  Filled 2015-01-07: qty 1

## 2015-01-07 MED ORDER — MIDAZOLAM HCL 2 MG/2ML IJ SOLN
INTRAMUSCULAR | Status: AC
  Filled 2015-01-07: qty 4

## 2015-01-07 MED ORDER — LACTATED RINGERS IV SOLN
INTRAVENOUS | Status: DC
Start: 2015-01-07 — End: 2015-01-07
  Administered 2015-01-07: 09:00:00 via INTRAVENOUS
  Administered 2015-01-07: 125 mL/h via INTRAVENOUS
  Administered 2015-01-07: 10:00:00 via INTRAVENOUS

## 2015-01-07 MED ORDER — OXYCODONE-ACETAMINOPHEN 5-325 MG PO TABS
1.0000 | ORAL_TABLET | ORAL | Status: DC | PRN
  Administered 2015-01-08 (×2): 1 via ORAL
  Filled 2015-01-07 (×3): qty 1

## 2015-01-07 MED ORDER — SCOPOLAMINE 1 MG/3DAYS TD PT72
1.0000 | MEDICATED_PATCH | Freq: Once | TRANSDERMAL | Status: DC
  Administered 2015-01-07: 1.5 mg via TRANSDERMAL

## 2015-01-07 MED ORDER — LIDOCAINE-EPINEPHRINE 1 %-1:100000 IJ SOLN
INTRAMUSCULAR | Status: DC | PRN
  Administered 2015-01-07: 40 mL

## 2015-01-07 MED ORDER — FENTANYL CITRATE (PF) 250 MCG/5ML IJ SOLN
INTRAMUSCULAR | Status: AC
  Filled 2015-01-07: qty 25

## 2015-01-07 MED ORDER — NEOSTIGMINE METHYLSULFATE 10 MG/10ML IV SOLN
INTRAVENOUS | Status: DC | PRN
  Administered 2015-01-07: 4 mg via INTRAVENOUS

## 2015-01-07 MED ORDER — KETOROLAC TROMETHAMINE 30 MG/ML IJ SOLN
INTRAMUSCULAR | Status: DC | PRN
  Administered 2015-01-07: 30 mg via INTRAVENOUS

## 2015-01-07 MED ORDER — HYDROMORPHONE 0.3 MG/ML IV SOLN
INTRAVENOUS | Status: DC
  Administered 2015-01-07: 0.4 mg via INTRAVENOUS
  Administered 2015-01-07: 13:00:00 via INTRAVENOUS
  Administered 2015-01-08: 0.799 mg via INTRAVENOUS
  Filled 2015-01-07: qty 25

## 2015-01-07 MED ORDER — LIDOCAINE HCL (CARDIAC) 20 MG/ML IV SOLN
INTRAVENOUS | Status: AC
  Filled 2015-01-07: qty 5

## 2015-01-07 MED ORDER — NALOXONE HCL 0.4 MG/ML IJ SOLN: 0.4000 mg | INTRAMUSCULAR | Status: DC | PRN

## 2015-01-07 MED ORDER — LABETALOL HCL 5 MG/ML IV SOLN
INTRAVENOUS | Status: AC
  Filled 2015-01-07: qty 4

## 2015-01-07 SURGICAL SUPPLY — 26 items
CANISTER SUCT 3000ML (MISCELLANEOUS) ×4 IMPLANT
CLOTH BEACON ORANGE TIMEOUT ST (SAFETY) ×4 IMPLANT
CONT PATH 16OZ SNAP LID 3702 (MISCELLANEOUS) ×4 IMPLANT
DECANTER SPIKE VIAL GLASS SM (MISCELLANEOUS) ×8 IMPLANT
GAUZE PACKING 2X5 YD STRL (GAUZE/BANDAGES/DRESSINGS) ×4 IMPLANT
GLOVE BIOGEL PI IND STRL 6.5 (GLOVE) ×4 IMPLANT
GLOVE BIOGEL PI IND STRL 7.0 (GLOVE) ×10 IMPLANT
GLOVE BIOGEL PI INDICATOR 6.5 (GLOVE) ×4
GLOVE BIOGEL PI INDICATOR 7.0 (GLOVE) ×10
GLOVE ECLIPSE 7.0 STRL STRAW (GLOVE) ×8 IMPLANT
GLOVE INDICATOR 7.5 STRL GRN (GLOVE) ×20 IMPLANT
GLOVE SURG SS PI 7.0 STRL IVOR (GLOVE) ×28 IMPLANT
GOWN STRL REUS W/TWL LRG LVL3 (GOWN DISPOSABLE) ×20 IMPLANT
NEEDLE SPNL 18GX3.5 QUINCKE PK (NEEDLE) ×4 IMPLANT
NS IRRIG 1000ML POUR BTL (IV SOLUTION) ×4 IMPLANT
PACK VAGINAL WOMENS (CUSTOM PROCEDURE TRAY) ×4 IMPLANT
PAD OB MATERNITY 4.3X12.25 (PERSONAL CARE ITEMS) ×4 IMPLANT
SUT VIC AB 0 CT1 18XCR BRD8 (SUTURE) ×6 IMPLANT
SUT VIC AB 0 CT1 27 (SUTURE) ×4
SUT VIC AB 0 CT1 27XBRD ANBCTR (SUTURE) ×4 IMPLANT
SUT VIC AB 0 CT1 8-18 (SUTURE) ×6
SUT VICRYL 0 TIES 12 18 (SUTURE) ×4 IMPLANT
SYR 20CC LL (SYRINGE) ×4 IMPLANT
TOWEL OR 17X24 6PK STRL BLUE (TOWEL DISPOSABLE) ×12 IMPLANT
TRAY FOLEY CATH SILVER 14FR (SET/KITS/TRAYS/PACK) ×4 IMPLANT
WATER STERILE IRR 1000ML POUR (IV SOLUTION) ×4 IMPLANT

## 2015-01-07 NOTE — Anesthesia Postprocedure Evaluation (Signed)
  Anesthesia Post-op Note  Patient: Janet Burnett  Procedure(s) Performed: Procedure(s): HYSTERECTOMY VAGINAL (N/A) BILATERAL SALPINGECTOMY (Bilateral)  Patient Location: PACU  Anesthesia Type:General  Level of Consciousness: awake, alert  and oriented  Airway and Oxygen Therapy: Patient Spontanous Breathing  Post-op Pain: mild  Post-op Assessment: Post-op Vital signs reviewed, Patient's Cardiovascular Status Stable, Respiratory Function Stable, Patent Airway, No signs of Nausea or vomiting and Adequate PO intake              Post-op Vital Signs: Reviewed and stable  Last Vitals:  Filed Vitals:   01/07/15 1215  BP: 123/78  Pulse: 73  Temp: 36.4 C  Resp: 16    Complications: No apparent anesthesia complications

## 2015-01-07 NOTE — Addendum Note (Signed)
Addendum  created 01/07/15 1557 by Hewitt Blade, CRNA   Modules edited: Notes Section   Notes Section:  File: [id number]

## 2015-01-07 NOTE — Discharge Instructions (Signed)
Vaginal Hysterectomy, Care After Refer to this sheet in the next few weeks. These instructions provide you with information on caring for yourself after your procedure. Your health care provider may also give you more specific instructions. Your treatment has been planned according to current medical practices, but problems sometimes occur. Call your health care provider if you have any problems or questions after your procedure.  WHAT TO EXPECT AFTER THE PROCEDURE After your procedure, it is typical to have the following:  Pain.  Feeling tired.  Poor appetite.  Less interest in sex. HOME CARE INSTRUCTIONS  It takes 4-6 weeks to recover from this surgery. Make sure you follow all your health care provider's instructions. Home care instructions may include:  Take pain medicines only as directed by your health care provider. Do not take over-the-counter pain medicines without checking with your health care provider first.  Take showers instead of baths for 2-3 weeks. Ask your health care provider when it is safe to start showering.  Do not douche, use tampons, or have sexual intercourse for at least 6 weeks or until your health care provider says you can.   Follow your health care provider's advice about exercise, lifting, driving, and general activities.  Get plenty of rest and sleep.   Do not lift anything heavier than a gallon of milk (about 10 lb [4.5 kg]) for the first month after surgery.  You can resume your normal diet if your health care provider says it is okay.   Do not drink alcohol until your health care provider says you can.   If you are constipated, ask your health care provider if you can take a mild laxative.  Eating foods high in fiber may also help with constipation. Eat plenty of raw fruits and vegetables, whole grains, and beans.  Drink enough fluids to keep your urine clear or pale yellow.   Try to have someone at home with you for the first 1-2 weeks  to help around the house.  Keep all follow-up appointments. SEEK MEDICAL CARE IF:   You have chills or fever.  You have swelling, redness, or pain in the area of your incision that is getting worse.   You have pus coming from the incision.   You notice a bad smell coming from the incision or bandage.   Your incision breaks open.   You feel dizzy or light-headed.   You have pain or bleeding when you urinate.   You have persistent diarrhea.   You have persistent nausea and vomiting.   You have abnormal vaginal discharge.   You have a rash.   You have any type of abnormal reaction or develop an allergy to your medicine.   Your pain medicine is not helping.  SEEK IMMEDIATE MEDICAL CARE IF:   You have a fever and your symptoms suddenly get worse.  You have severe abdominal pain.  You have chest pain.  You have shortness of breath.  You faint.  You have pain, swelling, or redness of your leg.  You have heavy vaginal bleeding with blood clots. MAKE SURE YOU:  Understand these instructions.  Will watch your condition.  Will get help right away if you are not doing well or get worse. Document Released: 11/27/2004 Document Revised: 05/15/2013 Document Reviewed: 03/02/2013 [facility] Patient Information 2015 [place], [place]. This information is not intended to replace advice given to you by your health care provider. Make sure you discuss any questions you have with your health care provider.

## 2015-01-07 NOTE — Transfer of Care (Signed)
Immediate Anesthesia Transfer of Care Note  Patient: Janet Burnett  Procedure(s) Performed: Procedure(s): HYSTERECTOMY VAGINAL (N/A) BILATERAL SALPINGECTOMY (Bilateral)  Patient Location: PACU  Anesthesia Type:General  Level of Consciousness: sedated  Airway & Oxygen Therapy: Patient Spontanous Breathing and Patient connected to nasal cannula oxygen  Post-op Assessment: Report given to RN  Post vital signs: Reviewed  Last Vitals:  Filed Vitals:   01/07/15 0831  BP: 153/101  Pulse:   Temp:   Resp:     Complications: No apparent anesthesia complications

## 2015-01-07 NOTE — Anesthesia Procedure Notes (Signed)
Procedure Name: Intubation Date/Time: 01/07/2015 9:19 AM Performed by: Talbot Grumbling Pre-anesthesia Checklist: Patient identified, Emergency Drugs available, Suction available and Patient being monitored Patient Re-evaluated:Patient Re-evaluated prior to inductionOxygen Delivery Method: Circle system utilized Preoxygenation: Pre-oxygenation with 100% oxygen Intubation Type: IV induction Ventilation: Mask ventilation without difficulty Laryngoscope Size: Mac and 3 Grade View: Grade II Tube type: Oral Tube size: 7.0 mm Number of attempts: 1 Airway Equipment and Method: Stylet Placement Confirmation: ETT inserted through vocal cords under direct vision,  positive ETCO2 and breath sounds checked- equal and bilateral Secured at: 20 cm Tube secured with: Tape Dental Injury: Teeth and Oropharynx as per pre-operative assessment

## 2015-01-07 NOTE — Interval H&P Note (Signed)
History and Physical Interval Note:  01/07/2015 9:03 AM  Janet Burnett  has presented today for surgery, with the diagnosis of  Uterine fibroids  The various methods of treatment have been discussed with the patient and family. After consideration of risks, benefits and other options for treatment, the patient has consented to  Procedure(s): HYSTERECTOMY VAGINAL (N/A) BILATERAL SALPINGECTOMY (Bilateral) as a surgical intervention .  The patient's history has been reviewed, patient examined, no change in status, stable for surgery.  I have reviewed the patient's chart and labs.  Questions were answered to the patient's satisfaction.     [place]

## 2015-01-07 NOTE — Anesthesia Postprocedure Evaluation (Signed)
  Anesthesia Post-op Note  Patient: Janet Burnett  Procedure(s) Performed: Procedure(s): HYSTERECTOMY VAGINAL (N/A) BILATERAL SALPINGECTOMY (Bilateral)  Patient Location: [facility]  Anesthesia Type:General  Level of Consciousness: awake, alert  and oriented  Airway and Oxygen Therapy: Patient Spontanous Breathing and Patient connected to nasal cannula oxygen  Post-op Pain: none  Post-op Assessment: Post-op Vital signs reviewed and Patient's Cardiovascular Status Stable              Post-op Vital Signs: Reviewed and stable  Last Vitals:  Filed Vitals:   01/07/15 1507  BP: 122/80  Pulse: 80  Temp: 36.5 C  Resp: 14    Complications: No apparent anesthesia complications

## 2015-01-07 NOTE — Anesthesia Preprocedure Evaluation (Addendum)
Anesthesia Evaluation  Patient identified by MRN, date of birth, ID band Patient awake    Reviewed: Allergy & Precautions, NPO status , Patient's Chart, lab work & pertinent test results  Airway Mallampati: I  TM Distance: >3 FB Neck ROM: Full    Dental  (+) Teeth Intact   Pulmonary neg pulmonary ROS,  breath sounds clear to auscultation  Pulmonary exam normal       Cardiovascular Exercise Tolerance: Good hypertension, Pt. on medications negative cardio ROS Normal cardiovascular examRhythm:Regular Rate:Normal     Neuro/Psych negative neurological ROS     GI/Hepatic negative GI ROS, Neg liver ROS,   Endo/Other  negative endocrine ROS  Renal/GU negative Renal ROS  Female GU complaint     Musculoskeletal negative musculoskeletal ROS (+)   Abdominal   Peds  Hematology negative hematology ROS (+) anemia ,   Anesthesia Other Findings Day of surgery medications reviewed with the patient.  Reproductive/Obstetrics                            Anesthesia Physical Anesthesia Plan  ASA: II  Anesthesia Plan: General   Post-op Pain Management:    Induction: Intravenous  Airway Management Planned: Oral ETT  Additional Equipment:   Intra-op Plan:   Post-operative Plan: Extubation in OR  Informed Consent: I have reviewed the patients History and Physical, chart, labs and discussed the procedure including the risks, benefits and alternatives for the proposed anesthesia with the patient or authorized representative who has indicated his/her understanding and acceptance.   Dental advisory given  Plan Discussed with: CRNA  Anesthesia Plan Comments: (Risks/benefits of general anesthesia discussed with patient including risk of damage to teeth, lips, gum, and tongue, nausea/vomiting, allergic reactions to medications, and the possibility of heart attack, stroke and death.  All patient questions  answered.  Patient wishes to proceed.)        Anesthesia Quick Evaluation

## 2015-01-07 NOTE — Op Note (Signed)
Preoperative diagnosis: fibroid uterus, menorrhagia, dysmenorrhea  Postoperative diagnosis: Same  Procedure: Transvaginal hysterectomy, bilateral salpingectomy   Surgeon: Standley Dakins. Kennon Rounds, M.D.  Assistant: Lavonia Drafts, MD  Anesthesia: General ETT-,Stephen Leanord Hawking, MD  Findings: 638 gm uterus, multiple fibroids, normal appearing tubes and ovaries.  Estimated blood loss: 400 cc  Specimen: Uterus and tubes to pathology  Reason for procedure: Patient had long h/o bleeding and pelvic pain. EMB and w/u done in [place]. The patient desired definitive treatment.  Risks of  hysterectomy reviewed.  Risks include but are not limited to bleeding, infection, injury to surrounding structures, including bowel, bladder and ureters, blood clots, and death.  Likelihood of success of surgery is high.   Procedure: Patient was taken to the OR where she was placed in dorsal lithotomy in [place]. She was prepped and draped in the usual sterile fashion. A timeout was performed. The patient received 2 g of Ancef prior to procedure. The patient had SCDs in place.  A speculum was placed inside the vagina. The cervix was visualized and grasped with 2 doublle-tooth tenacula. 40 cc of 1% lidocaine with epinephrine were injected paracervically. A knife was used to make a circumferential incision around the vagina. An opened sponge was used to dissect the vagina off the cervix. The posterior peritoneum was entered sharply with Mayo scissors. The posterior peritoneum was tagged to the vaginal cuff with a single stitch. The anterior peritoneal cavity was entered sharply with careful dissection of the bladder off the underlying cervix. A Heaney clamp was used to clamp first the left uterosacral ligament and cardinal which was then cut and Haney suture ligated with 0 Vicryl stitch, the stitch was held. Similarly the right uterosacral ligament was clamped cut and suture ligated. Sequential bites up the broad to  the uterine arteries were taken until the blood supply was gotten. A coring technique was used to remove the large uterus with several fibroids being removed until the tubo-ovarian pedicles were encountered. These were clamped with Heaney clamps and the uterus was removed.  The right and left tubes were easily visualized. The ltubes were grasped with Babcock clamp and a Kelly clamp was used to secure the pedicle and the tubes were removed and ligated with a free tie.  Inspection of all pedicles revealed adequate hemostasis. There was some bleeding noted at the vaginal cuff on the left side. The vagina was closed with 0 Vicryl suture in a locked running fashion with care taken to incorporate the uterosacral pedicles and the peritoneal edge. Excellent hemostasis was noted at the end of the case. The vaginal cuff was inspected there was minimal bleeding noted.  A Foley catheter is placed inside her bladder. Clear, yellow urine was noted. Estrace soaked vaginal packing 1 inch, placed inside the vagina. All instrument needle and lap counts were correct x 2. Patient was awakened taken to recovery room in stable condition.  Donnamae Jude, MD 01/07/2015, 10:44 AM

## 2015-01-08 ENCOUNTER — Encounter (HOSPITAL_COMMUNITY): Payer: Self-pay | Admitting: Family Medicine

## 2015-01-08 LAB — BASIC METABOLIC PANEL
Anion gap: 8 (ref 5–15)
BUN: 8 mg/dL (ref 6–20)
CALCIUM: 8.9 mg/dL (ref 8.9–10.3)
CO2: 25 mmol/L (ref 22–32)
CREATININE: 0.85 mg/dL (ref 0.44–1.00)
Chloride: 106 mmol/L (ref 101–111)
GFR calc non Af Amer: >60 mL/min (ref >60)
Glucose, Bld: 158 mg/dL — ABNORMAL HIGH (ref 65–99)
Potassium: 3.5 mmol/L (ref 3.5–5.1)
SODIUM: 139 mmol/L (ref 135–145)

## 2015-01-08 LAB — CBC
HCT: 33.7 % — ABNORMAL LOW (ref 36.0–46.0)
Hemoglobin: 11.1 g/dL — ABNORMAL LOW (ref 12.0–15.0)
MCH: 28.2 pg (ref 26.0–34.0)
MCHC: 32.9 g/dL (ref 30.0–36.0)
MCV: 85.8 fL (ref 78.0–100.0)
PLATELETS: 259 10*3/uL (ref 150–400)
RBC: 3.93 MIL/uL (ref 3.87–5.11)
RDW: 15 % (ref 11.5–15.5)
WBC: 9.3 10*3/uL (ref 4.0–10.5)

## 2015-01-08 NOTE — Progress Notes (Signed)
Pt is dischareged in the care of sister per ambulatory, with R.N. Escort. Denies any pain or discomfort. Stable. Spirits are good.No equipment needed for home use. No vaginal bleeding.  Discharged instructions given with good understanding. Questions asked and answered,

## 2015-01-08 NOTE — Progress Notes (Signed)
Late Entry:  1300p - Case Manager noted consult for medication assistance.  CM spoke w/ pt's Nurse, pt has Rx for Percocet and also instructed to take Ibuprofen 600mg  as needed.  CM spoke w/ the pt regarding her medications. Pt stated that she is not working at this time.  Plans to go back to work once recovered from this surgery.  CM explained that we could not assist w/ her pain medication (narcotic) and that Ibuprofen is an over the counter medication roughly $3 to $4.  Suggested that pt check w/ her pharmacy ([facility]) regarding cost of Percocet or check w/ [facility] and gave the pt the phone number. Pt can also get Ibuprofen at [facility]. Pt's questions were answered.  Pt's Nurse aware of dc plan.  [place],  [place]

## 2015-01-08 NOTE — Progress Notes (Signed)
Vaginal packing removed as ordered. Packing intact. Minimal serosanguineous drainage noted. Patient tolerated well.

## 2015-01-08 NOTE — Discharge Summary (Signed)
Physician Discharge Summary  Patient ID: Janet Burnett MRN: [id number] DOB/AGE: 1975/09/13 39 y.o.  Admit date: 01/07/2015 Discharge date:   Admission Diagnoses:  Principal Problem:   Fibroids Active Problems:   Anemia due to chronic blood loss   Menorrhagia with regular cycle   Dysmenorrhea   Fibroid   Discharge Diagnoses:  Same  Past Medical History  Diagnosis Date  . Anemia   . Fibroid   . Blood transfusion without reported diagnosis 05/13/14    In [place], 2 units transfused   . SVD (spontaneous vaginal delivery)     x 2  . Hypertension     Surgeries: Procedure(s): HYSTERECTOMY VAGINAL BILATERAL SALPINGECTOMY on 01/07/2015   Consultants:  None  Discharged Condition: Stable  Hospital Course: Janet Burnett is an 39 y.o. female [id number] who was admitted 01/07/2015 with a diagnosis of fibroids, bleeding and pelvic pain who desired definitive treatment They were brought to the operating room on 01/07/2015 and underwent the above named procedures.    They were given perioperative antibiotics:  Anti-infectives    Start     Dose/Rate Route Frequency Ordered Stop   01/07/15 0853  ceFAZolin (ANCEF) IVPB 2 g/50 mL premix     2 g 100 mL/hr over 30 Minutes Intravenous On call to O.R. 01/07/15 0853 01/07/15 0915   01/07/15 0847  ceFAZolin (ANCEF) 2-3 GM-% IVPB SOLR    Comments:  Harvell, Gwendolyn  : cabinet override      01/07/15 0847 01/07/15 2059    .  They were given sequential compression devices, early ambulation, and mechanical DVT prophylaxis. Postoperatively patient was voiding, tolerating a regular diet and was stable for discharge.  They benefited maximally from their hospital stay and there were no complications.    Recent vital signs:  Filed Vitals:   01/08/15 0555  BP: 119/72  Pulse: 68  Temp: 98.8 F (37.1 C)  Resp: 14   Discharge exam: Physical Examination: General appearance - alert, well appearing, and in no distress Mental status -  alert, oriented to person, place, and time Chest - normal effort Abdomen - no mass, appropriate tenderness Extremities - peripheral pulses normal, no pedal edema, no clubbing or cyanosis, no pedal edema noted Skin - normal coloration and turgor, no rashes, no suspicious skin lesions noted  Recent laboratory studies:  Results for orders placed or performed during the hospital encounter of 01/07/15  Pregnancy, urine  Result Value Ref Range   Preg Test, Ur NEGATIVE NEGATIVE  CBC  Result Value Ref Range   WBC 9.3 4.0 - 10.5 K/uL   RBC 3.93 3.87 - 5.11 MIL/uL   Hemoglobin 11.1 (L) 12.0 - 15.0 g/dL   HCT 33.7 (L) 36.0 - 46.0 %   MCV 85.8 78.0 - 100.0 fL   MCH 28.2 26.0 - 34.0 pg   MCHC 32.9 30.0 - 36.0 g/dL   RDW 15.0 11.5 - 15.5 %   Platelets 259 150 - 400 K/uL  Basic metabolic panel  Result Value Ref Range   Sodium 139 135 - 145 mmol/L   Potassium 3.5 3.5 - 5.1 mmol/L   Chloride 106 101 - 111 mmol/L   CO2 25 22 - 32 mmol/L   Glucose, Bld 158 (H) 65 - 99 mg/dL   BUN 8 6 - 20 mg/dL   Creatinine, Ser 0.85 0.44 - 1.00 mg/dL   Calcium 8.9 8.9 - 10.3 mg/dL   GFR calc non Af Amer >60 >60 mL/min   GFR calc Af Amer >60 >60 mL/min  Anion gap 8 5 - 15    Discharge Medications:     Medication List    STOP taking these medications        Iron 325 (65 FE) MG Tabs     megestrol 40 MG tablet  Commonly known as:  MEGACE     potassium chloride 10 MEQ tablet  Commonly known as:  K-DUR      TAKE these medications        amLODipine 5 MG tablet  Commonly known as:  NORVASC  Take 5 mg by mouth daily.     BIOTIN PO  Take 2 each by mouth daily.     Fish Oil 1000 MG Caps  Take 1 capsule by mouth daily.     ibuprofen 200 MG tablet  Commonly known as:  ADVIL,MOTRIN  Take 800 mg by mouth every 6 (six) hours as needed for mild pain or cramping.     multivitamin with minerals Tabs tablet  Take 1 tablet by mouth daily.     oxyCODONE-acetaminophen 5-325 MG per tablet  Commonly  known as:  PERCOCET/ROXICET  Take 1-2 tablets by mouth every 6 (six) hours as needed.       Disposition: 01-Home or Self Care      Discharge Instructions    Call MD for:  persistant nausea and vomiting    Complete by:  As directed      Call MD for:  severe uncontrolled pain    Complete by:  As directed      Call MD for:  temperature >100.4    Complete by:  As directed      Diet - low sodium heart healthy    Complete by:  As directed      Increase activity slowly    Complete by:  As directed      Lifting restrictions    Complete by:  As directed   Nothing > 20 lbs x 4 wks     No wound care    Complete by:  As directed      Sexual Activity Restrictions    Complete by:  As directed   None x 4-6 wks           Follow-up Information    Follow up with [facility] In 2 weeks.   Specialty:  Obstetrics and Gynecology   Why:  postop check, they will call you with an appointment   Contact information:   [place] [phone]       Signed: Donnamae Jude 01/08/2015, 9:50 AM

## 2015-01-09 ENCOUNTER — Encounter: Payer: Self-pay | Admitting: *Deleted

## 2015-01-29 ENCOUNTER — Encounter: Payer: Self-pay | Admitting: Obstetrics and Gynecology

## 2015-01-29 ENCOUNTER — Ambulatory Visit (INDEPENDENT_AMBULATORY_CARE_PROVIDER_SITE_OTHER): Payer: Self-pay | Admitting: Obstetrics and Gynecology

## 2015-01-29 VITALS — BP 158/105 | HR 85 | Temp 98.8°F | Ht 63.0 in | Wt 171.6 lb

## 2015-01-29 DIAGNOSIS — I10 Essential (primary) hypertension: Secondary | ICD-10-CM

## 2015-01-29 DIAGNOSIS — O1002 Pre-existing essential hypertension complicating childbirth: Secondary | ICD-10-CM

## 2015-01-29 NOTE — Progress Notes (Signed)
States has not taken norvasc since surgery because has not been able to afford paying for the rerfill

## 2015-01-29 NOTE — Progress Notes (Signed)
CLINIC ENCOUNTER NOTE  History:  39 y.o. [id number] here today for post-op check.  Transvaginal hysterectomy w/ bilateral salpingectomy 2 weeks ago. Pain is mild. Has more gas than usual, taking a stool softener from [employer] since yesterda. No bleeding. No fevers, chills, nausea, or vomiting. Tolerating.  Out of BP meds.   Past Medical History  Diagnosis Date  . Anemia   . Fibroid   . Blood transfusion without reported diagnosis 05/13/14    In [place], 2 units transfused   . SVD (spontaneous vaginal delivery)     x 2  . Hypertension     Past Surgical History  Procedure Laterality Date  . Wisdom tooth extraction    . 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 Vaginal hysterectomy N/A 01/07/2015    Procedure: HYSTERECTOMY VAGINAL;  Surgeon: Donnamae Jude, MD;  Location: [facility];  Service: Gynecology;  Laterality: N/A;  . Bilateral salpingectomy Bilateral 01/07/2015    Procedure: BILATERAL SALPINGECTOMY;  Surgeon: Donnamae Jude, MD;  Location: [facility];  Service: Gynecology;  Laterality: Bilateral;    The following portions of the patient's history were reviewed and updated as appropriate: allergies, current medications, past family history, past medical history, past social history, past surgical history and problem list.    Review of Systems:  See above; comprehensive review of systems was otherwise negative.  Objective:  Physical Exam BP 158/105 mmHg  Pulse 85  Temp(Src) 98.8 F (37.1 C)  Ht 5\' 3"  (1.6 m)  Wt 171 lb 9.6 oz (77.837 kg)  BMI 30.41 kg/m2 CONSTITUTIONAL: Well-developed, well-nourished female in no acute distress.  HENT:  Normocephalic, atraumatic SKIN: Skin is warm and dry.  [place]: Alert  PSYCHIATRIC: Normal mood and affect.  CARDIOVASCULAR: Normal heart rate noted RESPIRATORY: Effort and breath sounds normal, no problems with respiration noted ABDOMEN: Soft, no distention noted.  No tenderness, rebound or guarding.  PELVIC: Deferred   Labs  and Imaging No results found.  Assessment & Plan:   # post-op check, s/p transvaginal hysterectomy - healing well, no concerns - f/u 4 weeks for speculum exam  # Hypertension - moderate elevation off meds - re-start amlodipine, counseled to obtain primary care for further monitoring and dose titration  Routine preventative health maintenance measures emphasized.     Romuald Mccaslin B. Briseyda Fehr, [place] for [employer], [place]

## 2015-02-24 ENCOUNTER — Ambulatory Visit: Payer: Self-pay | Admitting: Family Medicine

## 2016-10-06 ENCOUNTER — Encounter: Payer: Self-pay | Admitting: Gynecology

## 2017-02-21 DIAGNOSIS — A599 Trichomoniasis, unspecified: Secondary | ICD-10-CM

## 2017-02-21 HISTORY — DX: Trichomoniasis, unspecified: A59.9

## 2017-04-07 ENCOUNTER — Encounter (HOSPITAL_COMMUNITY): Payer: Self-pay | Admitting: *Deleted

## 2017-04-07 ENCOUNTER — Emergency Department (HOSPITAL_COMMUNITY): Payer: BLUE CROSS/BLUE SHIELD

## 2017-04-07 ENCOUNTER — Emergency Department (HOSPITAL_COMMUNITY)
Admission: EM | Admit: 2017-04-07 | Discharge: 2017-04-07 | Disposition: A | Discharge status: Home / Self Care | Payer: BLUE CROSS/BLUE SHIELD | Attending: Emergency Medicine | Admitting: Emergency Medicine

## 2017-04-07 ENCOUNTER — Other Ambulatory Visit: Payer: Self-pay

## 2017-04-07 DIAGNOSIS — I10 Essential (primary) hypertension: Secondary | ICD-10-CM

## 2017-04-07 DIAGNOSIS — Z79899 Other long term (current) drug therapy: Secondary | ICD-10-CM | POA: Diagnosis not present

## 2017-04-07 DIAGNOSIS — M6281 Muscle weakness (generalized): Secondary | ICD-10-CM | POA: Diagnosis not present

## 2017-04-07 DIAGNOSIS — R42 Dizziness and giddiness: Secondary | ICD-10-CM | POA: Diagnosis not present

## 2017-04-07 DIAGNOSIS — R55 Syncope and collapse: Secondary | ICD-10-CM | POA: Diagnosis not present

## 2017-04-07 LAB — DIFFERENTIAL
Basophils Absolute: 0.1 10*3/uL (ref 0.0–0.1)
Basophils Relative: 1 %
EOS PCT: 4 %
Eosinophils Absolute: 0.2 10*3/uL (ref 0.0–0.7)
LYMPHS ABS: 1.8 10*3/uL (ref 0.7–4.0)
LYMPHS PCT: 39 %
MONO ABS: 0.3 10*3/uL (ref 0.1–1.0)
MONOS PCT: 5 %
Neutro Abs: 2.4 10*3/uL (ref 1.7–7.7)
Neutrophils Relative %: 51 %

## 2017-04-07 LAB — I-STAT CHEM 8, ED
BUN: 6 mg/dL (ref 6–20)
CREATININE: 0.8 mg/dL (ref 0.44–1.00)
Calcium, Ion: 1.19 mmol/L (ref 1.15–1.40)
Chloride: 101 mmol/L (ref 101–111)
GLUCOSE: 82 mg/dL (ref 65–99)
HCT: 42 % (ref 36.0–46.0)
HEMOGLOBIN: 14.3 g/dL (ref 12.0–15.0)
Potassium: 4.2 mmol/L (ref 3.5–5.1)
Sodium: 139 mmol/L (ref 135–145)
TCO2: 30 mmol/L (ref 22–32)

## 2017-04-07 LAB — COMPREHENSIVE METABOLIC PANEL
ALK PHOS: 73 U/L (ref 38–126)
ALT: 12 U/L — ABNORMAL LOW (ref 14–54)
ANION GAP: 5 (ref 5–15)
AST: 15 U/L (ref 15–41)
Albumin: 3.9 g/dL (ref 3.5–5.0)
BUN: 6 mg/dL (ref 6–20)
CALCIUM: 9 mg/dL (ref 8.9–10.3)
CHLORIDE: 104 mmol/L (ref 101–111)
CO2: 28 mmol/L (ref 22–32)
Creatinine, Ser: 0.75 mg/dL (ref 0.44–1.00)
Glucose, Bld: 85 mg/dL (ref 65–99)
Potassium: 3.9 mmol/L (ref 3.5–5.1)
Sodium: 137 mmol/L (ref 135–145)
Total Bilirubin: 0.6 mg/dL (ref 0.3–1.2)
Total Protein: 7.4 g/dL (ref 6.5–8.1)

## 2017-04-07 LAB — APTT: aPTT: 36 seconds (ref 24–36)

## 2017-04-07 LAB — CBC
HCT: 42.5 % (ref 36.0–46.0)
HEMOGLOBIN: 14.1 g/dL (ref 12.0–15.0)
MCH: 29 pg (ref 26.0–34.0)
MCHC: 33.2 g/dL (ref 30.0–36.0)
MCV: 87.4 fL (ref 78.0–100.0)
Platelets: 271 10*3/uL (ref 150–400)
RBC: 4.86 MIL/uL (ref 3.87–5.11)
RDW: 13.9 % (ref 11.5–15.5)
WBC: 4.7 10*3/uL (ref 4.0–10.5)

## 2017-04-07 LAB — I-STAT TROPONIN, ED: TROPONIN I, POC: 0 ng/mL (ref 0.00–0.08)

## 2017-04-07 LAB — PROTIME-INR
INR: 0.96
Prothrombin Time: 12.7 seconds (ref 11.4–15.2)

## 2017-04-07 LAB — CBG MONITORING, ED: GLUCOSE-CAPILLARY: 70 mg/dL (ref 65–99)

## 2017-04-07 NOTE — ED Notes (Signed)
Spoke with Dr. Thomasene Lot about patient symptoms and not calling code stroke at this time.

## 2017-04-07 NOTE — ED Triage Notes (Signed)
Pt states at work and she felt dizzy, lightheaded, unable to concentrate, and both legs week. Took blood pressure at work and pt states all the sudden her symptoms started at 49. Pt has history of high blood pressure.

## 2017-04-07 NOTE — ED Provider Notes (Signed)
[facility] EMERGENCY DEPARTMENT Provider Note   CSN: [id number] Arrival date & time: 04/07/17  1123     History   Chief Complaint Chief Complaint  Patient presents with  . Near Syncope  . Hypertension    HPI Janet Burnett is a 41 y.o. female.  HPI   41 year old female with history of hypertension, anemia presenting for evaluation of near syncope.  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  She reported having mental fogginess, not feeling well, she went to the bathroom to urinate and when walking back she felt lightheadedness and weakness in both of her legs.  She also endorsed a pressure sensation across her forehead.  The symptom is mostly resolved while she in the ER.  She denies any room spinning sensation.  No complaints of vision changes, confusion, focal numbness or weakness, neck pain.  She reports trying to eat healthy recently and has been changing her diet for the past 2 weeks.  She drank some ginger tea and ate half a vegetarian omelette this morning.  She is feeling hungry.  She denies any drug use of recent alcohol use.  No prior history of stroke.  Does admits to having high blood pressure but not taking any blood pressure medication.  Past Medical History:  Diagnosis Date  . Anemia   . Blood transfusion without reported diagnosis 05/13/14   In [place], 2 units transfused   . Fibroid   . Hypertension   . SVD (spontaneous vaginal delivery)    x 2    Patient Active Problem List   Diagnosis Date Noted  . Fibroid 01/07/2015  . Essential hypertension 09/17/2014  . Anemia due to chronic blood loss 08/06/2014  . History of transfusion 08/06/2014  . Menorrhagia with regular cycle 08/06/2014  . Dysmenorrhea 08/06/2014  . Fibroids 07/25/2014    Past Surgical History:  Procedure Laterality Date  . BILATERAL SALPINGECTOMY Bilateral 01/07/2015   Procedure: BILATERAL SALPINGECTOMY;  Surgeon: Donnamae Jude, MD;  Location: [facility];  Service: Gynecology;  Laterality: Bilateral;  . 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 VAGINAL HYSTERECTOMY N/A 01/07/2015   Procedure: HYSTERECTOMY VAGINAL;  Surgeon: Donnamae Jude, MD;  Location: [facility];  Service: Gynecology;  Laterality: N/A;  . WISDOM TOOTH EXTRACTION      OB History    Gravida Para Term Preterm AB Living   4 2     2 2    SAB TAB Ectopic Multiple Live Births                   Home Medications    Prior to Admission medications   Medication Sig Start Date End Date Taking? Authorizing Provider  amLODipine (NORVASC) 5 MG tablet Take 5 mg by mouth daily.    [provider]  BIOTIN PO Take 2 each by mouth daily.    [provider]  ibuprofen (ADVIL,MOTRIN) 200 MG tablet Take 800 mg by mouth every 6 (six) hours as needed for mild pain or cramping.     [provider]  Multiple Vitamin (MULTIVITAMIN WITH MINERALS) TABS tablet Take 1 tablet by mouth daily.    [provider]  Omega-3 Fatty Acids (FISH OIL) 1000 MG CAPS Take 1 capsule by mouth daily.    [provider]  oxyCODONE-acetaminophen (PERCOCET/ROXICET) 5-325 MG per tablet Take 1-2 tablets by  mouth every 6 (six) hours as needed. Patient not taking: Reported on 01/29/2015 01/07/15   Donnamae Jude, MD    Family History Family History  Problem Relation Age of Onset  . Diabetes Maternal Grandmother   . Hypertension Maternal Grandmother   . Breast cancer Paternal Grandmother     Social History Social History   Tobacco Use  . Smoking status: Never Smoker  . Smokeless tobacco: Never Used  Substance Use Topics  . Alcohol use: Yes    Alcohol/week: 0.0 oz    Comment: WINE OCC  . Drug use: No     Allergies   Patient has no known allergies.   Review of Systems Review of Systems  All other systems reviewed and are negative.    Physical Exam Updated Vital Signs BP  (!) 162/103   Pulse 71   Temp 98.2 F (36.8 C) (Oral)   Resp 13   LMP 12/09/2014   SpO2 100%   Physical Exam  Constitutional: She is oriented to person, place, and time. She appears well-developed and well-nourished. No distress.  HENT:  Head: Atraumatic.  Right Ear: External ear normal.  Left Ear: External ear normal.  Mouth/Throat: Oropharynx is clear and moist.  Eyes: Conjunctivae and EOM are normal. Pupils are equal, round, and reactive to light.  Neck: Normal range of motion. Neck supple.  No nuchal rigidity  Cardiovascular: Normal rate and regular rhythm.  Pulmonary/Chest: Effort normal and breath sounds normal.  Abdominal: Soft. Bowel sounds are normal. There is no tenderness.  Neurological: She is alert and oriented to person, place, and time.  Neurologic exam:  Speech clear, pupils equal round reactive to light, extraocular movements intact  Normal peripheral visual fields Cranial nerves III through XII normal including no facial droop Follows commands, moves all extremities x4, normal strength to bilateral upper and lower extremities at all major muscle groups including grip Sensation normal to light touch  Coordination intact, no limb ataxia, finger-nose-finger normal Rapid alternating movements normal No pronator drift Gait normal   Skin: No rash noted.  Psychiatric: She has a normal mood and affect.  Nursing note and vitals reviewed.    ED Treatments / Results  Labs (all labs ordered are listed, but only abnormal results are displayed) Labs Reviewed  COMPREHENSIVE METABOLIC PANEL - Abnormal; Notable for the following components:      Result Value   ALT 12 (*)    All other components within normal limits  PROTIME-INR  APTT  CBC  DIFFERENTIAL  I-STAT TROPONIN, ED  CBG MONITORING, ED  I-STAT CHEM 8, ED    EKG  EKG Interpretation  Date/Time:  Thursday April 07 2017 13:44:57 EST Ventricular Rate:  79 PR Interval:    QRS Duration: 99 QT  Interval:  447 QTC Calculation: 513 R Axis:   -7 Text Interpretation:  Sinus rhythm Low voltage, precordial leads Borderline T abnormalities, lateral leads Prolonged QT interval Confirmed by Tanna Furry [phone]) on 04/07/2017 1:47:55 PM       Radiology Ct Head Wo Contrast  Result Date: 04/07/2017 CLINICAL DATA:  Neuro deficits, subacute. Dizziness and lightheadedness. Unable to concentrate. EXAM: CT HEAD WITHOUT CONTRAST TECHNIQUE: Contiguous axial images were obtained from the base of the skull through the vertex without intravenous contrast. COMPARISON:  None. FINDINGS: Brain: No evidence of acute infarction, hemorrhage, hydrocephalus, extra-axial collection or mass lesion/mass effect. Partially empty sella. Vascular: No hyperdense vessel or unexpected calcification. Skull: Normal. Negative for fracture or focal lesion. Sinuses/Orbits: No acute  finding. IMPRESSION: Negative.  No explanation for symptoms. Electronically Signed   By: Monte Fantasia M.D.   On: 04/07/2017 13:27    Procedures Procedures (including critical care time)  Medications Ordered in ED Medications - No data to display   Initial Impression / Assessment and Plan / ED Course  I have reviewed the triage vital signs and the nursing notes.  Pertinent labs & imaging results that were available during my care of the patient were reviewed by me and considered in my medical decision making (see chart for details).     BP (!) 156/100   Pulse 67   Temp 98.2 F (36.8 C) (Oral)   Resp 15   LMP 12/09/2014   SpO2 100%    Final Clinical Impressions(s) / ED Diagnoses   Final diagnoses:  Near syncope  Hypertension, unspecified type    ED Discharge Orders    None     12:59 PM Patient here with near syncope and leg weakness.  She has no focal neuro deficit on exam.  Her vital signs demonstrate elevated blood pressure but she does not have any active chest pain because of a headache.  Will obtain a CT scan of the  head.  Will check orthostatic vital signs.  1:40 PM Labs are reassuring, normal orthostatic vital sign, normal head CT scan, patient move about without any difficulty.  She is stable for discharge.  Recommend follow-up with PCP next week to have a blood pressure recheck.  Return precautions discussed.   Domenic Moras, PA-C 04/07/17 1349    Tanna Furry, MD 04/20/17 [phone]

## 2017-04-07 NOTE — ED Notes (Signed)
Checked patient blood sugar it was 70 notified RN of blood sugar

## 2017-04-07 NOTE — ED Notes (Signed)
Pt states she understands intsructions. Home stable with sister with steady gait.

## 2017-04-07 NOTE — ED Provider Notes (Signed)
MSE was initiated and I personally evaluated the patient and placed orders (if any) at  11:46 AM on April 07, 2017.  The patient appears stable so that the remainder of the MSE may be completed by another provider.  Patient at work this morning when suddenly felt foggy and not right.  On arrival to the ER, blood pressure was elevated at 191/119, and I was asked to evaluate the patient for strokelike symptoms. Exam showed no gross neurologic deficits.  Cranial nerves intact.  No slurred speech, no pronator drift, negative Romberg.  Pupils equal bilaterally.  Strength and sensation intact x4.  Patient reports continuation of headache, but no other complaints at this time.  She states she has a history of high blood pressure, was taking blood pressure medicine in 2016, but stopped because her blood pressure was improved.  She is not on anything currently.    Franchot Heidelberg, PA-C 04/07/17 1836    Macarthur Critchley, MD 04/09/17 [phone]

## 2017-04-15 ENCOUNTER — Encounter: Payer: Self-pay | Admitting: Family Medicine

## 2017-04-15 ENCOUNTER — Ambulatory Visit: Payer: BLUE CROSS/BLUE SHIELD | Admitting: Family Medicine

## 2017-04-15 ENCOUNTER — Other Ambulatory Visit: Payer: Self-pay

## 2017-04-15 VITALS — BP 130/90 | HR 100 | Temp 98.7°F | Wt 176.6 lb

## 2017-04-15 DIAGNOSIS — L739 Follicular disorder, unspecified: Secondary | ICD-10-CM

## 2017-04-15 DIAGNOSIS — Z0000 Encounter for general adult medical examination without abnormal findings: Secondary | ICD-10-CM | POA: Diagnosis not present

## 2017-04-15 DIAGNOSIS — R9431 Abnormal electrocardiogram [ECG] [EKG]: Secondary | ICD-10-CM | POA: Diagnosis not present

## 2017-04-15 DIAGNOSIS — I10 Essential (primary) hypertension: Secondary | ICD-10-CM

## 2017-04-15 LAB — POCT URINALYSIS DIP (MANUAL ENTRY)
Bilirubin, UA: NEGATIVE
Blood, UA: NEGATIVE
Glucose, UA: NEGATIVE mg/dL
Ketones, POC UA: NEGATIVE mg/dL
Leukocytes, UA: NEGATIVE
Nitrite, UA: NEGATIVE
Protein Ur, POC: NEGATIVE mg/dL
Spec Grav, UA: 1.015 (ref 1.010–1.025)
Urobilinogen, UA: 0.2 E.U./dL
pH, UA: 7 (ref 5.0–8.0)

## 2017-04-15 NOTE — Patient Instructions (Signed)
     IF you received an x-ray today, you will receive an invoice from [facility]. Please contact [facility] at [phone] with questions or concerns regarding your invoice.   IF you received labwork today, you will receive an invoice from [facility]. Please contact [facility] at [phone] with questions or concerns regarding your invoice.   Our billing staff will not be able to assist you with questions regarding bills from these companies.  You will be contacted with the lab results as soon as they are available. The fastest way to get your results is to activate your My Chart account. Instructions are located on the last page of this paperwork. If you have not heard from us regarding the results in 2 weeks, please contact this office.     

## 2017-04-15 NOTE — Progress Notes (Signed)
11/23/20189:14 AM  Janet Burnett [birth year], 41 y.o. female [id number]  Chief Complaint  Patient presents with  . Establish Care  . Hypertension    HPI:   Patient is a 41 y.o. female with past medical history significant for HTN who presents today to establish care.  Patient diagnosed with HTN prior to her hysterectomy. Was prescribed amlodipine at that time. She took it for very short period of time until she was able to get a good routine of eating healthy and exercising established. She was able to come off meds, checks her BP at [facility] once a week, normally in pre-hypertensive range. She was seen in ER 11/8 for pre-syncope. Normal workup, labs and head CT, except QT prolonged on EKG. BP was high in the ER. She states that her younger brother has recently passed away and she has been grieving and not caring for herself as well as she should be. She otherwise feels well. Since her ER visit she has started being consistent with her healthy habits. ER records reviewed.  Her only concern today is scattered pimples on buttocks which she tends to pick at and then they become hyperpigmented.  Depression screen [facility] 2/9 04/15/2017 04/15/2017  Decreased Interest 0 0  Down, Depressed, Hopeless 0 0  PHQ - 2 Score 0 0    No Known Allergies  Prior to Admission medications   Medication Sig Start Date End Date Taking? Authorizing Provider  Multiple Vitamin (MULTIVITAMIN WITH MINERALS) TABS tablet Take 1 tablet by mouth daily.   Yes [provider]    Past Medical History:  Diagnosis Date  . Blood transfusion without reported diagnosis 05/13/14   In [place], 2 units transfused   . Fibroid   . Hypertension   . SVD (spontaneous vaginal delivery)    x 2  . Trichimoniasis 02/2017    Past Surgical History:  Procedure Laterality Date  . ABDOMINAL HYSTERECTOMY  2016  . BILATERAL SALPINGECTOMY Bilateral 01/07/2015   Procedure: BILATERAL SALPINGECTOMY;  Surgeon: Donnamae Jude,  MD;  Location: [facility];  Service: Gynecology;  Laterality: Bilateral;  . 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 VAGINAL HYSTERECTOMY N/A 01/07/2015   Procedure: HYSTERECTOMY VAGINAL;  Surgeon: Donnamae Jude, MD;  Location: [facility];  Service: Gynecology;  Laterality: N/A;  . WISDOM TOOTH EXTRACTION      Social History   Tobacco Use  . Smoking status: Never Smoker  . Smokeless tobacco: Never Used  Substance Use Topics  . Alcohol use: Yes    Alcohol/week: 0.0 oz    Comment: WINE OCC    Family History  Problem Relation Age of Onset  . Stroke Mother   . Asthma Father   . Diabetes Maternal Grandmother   . Hypertension Maternal Grandmother   . Breast cancer Paternal Grandmother     Review of Systems  Constitutional: Negative for chills and fever.  Eyes: Negative for blurred vision and double vision.  Respiratory: Negative for cough and shortness of breath.   Cardiovascular: Negative for chest pain, palpitations and leg swelling.  Gastrointestinal: Negative for abdominal pain, nausea and vomiting.  Genitourinary: Negative for dysuria and hematuria.  Musculoskeletal: Negative for back pain and joint pain.  Skin: Positive for rash. Negative for itching.  Neurological: Positive for tingling (of left lower leg after sitting for prolonged periords). Negative for dizziness, speech change, focal weakness and headaches.  Psychiatric/Behavioral: Negative for depression. The patient is not nervous/anxious.  OBJECTIVE:  Blood pressure 130/90, pulse 100, temperature 98.7 F (37.1 C), weight 176 lb 9.6 oz (80.1 kg), last menstrual period 12/09/2014, SpO2 100 %.  Physical Exam  Constitutional: She is oriented to person, place, and time and well-developed, well-nourished, and in no distress.  HENT:  Head: Normocephalic and atraumatic.  Right Ear: Hearing, tympanic membrane, external ear and ear canal normal.  Left Ear: Hearing, tympanic membrane, external ear and ear canal  normal.  Mouth/Throat: Oropharynx is clear and moist.  Eyes: EOM are normal. Pupils are equal, round, and reactive to light.  Neck: Neck supple. No thyromegaly present.  Cardiovascular: Normal rate, regular rhythm, normal heart sounds and intact distal pulses. Exam reveals no gallop and no friction rub.  No murmur heard. Pulmonary/Chest: Effort normal and breath sounds normal. She has no wheezes. She has no rales.  Abdominal: Soft. Bowel sounds are normal. She exhibits no distension and no mass. There is no tenderness.  Musculoskeletal: Normal range of motion. She exhibits no edema.  Lymphadenopathy:    She has no cervical adenopathy.  Neurological: She is alert and oriented to person, place, and time. She has normal reflexes. Gait normal.  Skin: Skin is warm and dry. Rash (folliculitis) noted.  Psychiatric: Mood and affect normal.  Nursing note and vitals reviewed.  Results for orders placed or performed in visit on 04/15/17 (from the past 24 hour(s))  POCT urinalysis dipstick     Status: Normal   Collection Time: 04/15/17 10:07 AM  Result Value Ref Range   Color, UA yellow yellow   Clarity, UA clear clear   Glucose, UA negative negative mg/dL   Bilirubin, UA negative negative   Ketones, POC UA negative negative mg/dL   Spec Grav, UA 1.015 1.010 - 1.025   Blood, UA negative negative   pH, UA 7.0 5.0 - 8.0   Protein Ur, POC negative negative mg/dL   Urobilinogen, UA 0.2 0.2 or 1.0 E.U./dL   Nitrite, UA Negative Negative   Leukocytes, UA Negative Negative    ASSESSMENT and PLAN 1. Encounter for medical examination to establish care PMH, PSH, meds, allergies, Fhx, Shx, reviewed with patient today.  2. Essential hypertension Patient borderline today, managing with LFM. Discussed BP goal of < 140/90. Discussed continuing outpatient BP monitoring. ER precautions given. - POCT urinalysis dipstick  3. Prolonged Q-T interval on ECG Discussed findings, importance of avoidance of  certain meds OTC and prescribed. Repeat EKG at next visit  4. Folliculitis Discussed use of OTC antibiotic ointment. Importance of not picking, good skin care/mositerizing and sunscreen use.  Return in about 3 months (around 07/16/2017) for HTN, repeat EKG.    Rutherford Guys, MD [facility] [place], [place] [address] Ph.  [phone] Fax [phone]

## 2017-06-04 ENCOUNTER — Encounter: Payer: Self-pay | Admitting: Physician Assistant

## 2017-06-04 ENCOUNTER — Ambulatory Visit: Payer: BLUE CROSS/BLUE SHIELD | Admitting: Physician Assistant

## 2017-06-04 VITALS — BP 174/104 | HR 73 | Temp 97.9°F | Resp 16 | Ht 63.25 in | Wt 177.8 lb

## 2017-06-04 DIAGNOSIS — I10 Essential (primary) hypertension: Secondary | ICD-10-CM | POA: Diagnosis not present

## 2017-06-04 DIAGNOSIS — R350 Frequency of micturition: Secondary | ICD-10-CM | POA: Diagnosis not present

## 2017-06-04 NOTE — Patient Instructions (Signed)
     IF you received an x-ray today, you will receive an invoice from [facility]. Please contact [facility] at [phone] with questions or concerns regarding your invoice.   IF you received labwork today, you will receive an invoice from [facility]. Please contact [facility] at [phone] with questions or concerns regarding your invoice.   Our billing staff will not be able to assist you with questions regarding bills from these companies.  You will be contacted with the lab results as soon as they are available. The fastest way to get your results is to activate your My Chart account. Instructions are located on the last page of this paperwork. If you have not heard from us regarding the results in 2 weeks, please contact this office.     

## 2017-06-04 NOTE — Progress Notes (Signed)
06/04/2017 12:32 PM   DOB: June 17, 1975 / MRN: [id number]  SUBJECTIVE:  Janet Burnett is a 42 y.o. female presenting for right hand twitching that is worse at night. The twitching does seem to be getting better. Denies a family history of MS. She does not exercise at this time. She had a negative CT scan about 3 months ago due to an ED visit for the eval of near syncope, an event which also occurred while she was at work.    She has a history of HTN today and is declining medication. The chart shows she has been on Norvasc in the past.   She is worried about recalls on meds as well as there side effects.   She would like a work note stating that she can use the bathroom any time for as long as she wants while at work. She is working towards drinking a GALLON of water daily and is also take apple cider vinegar along with copious amount of smoothies.  She works in a call center and handles death claims.   She has No Known Allergies.   She  has a past medical history of Blood transfusion without reported diagnosis (05/13/14), Fibroid, Hypertension, SVD (spontaneous vaginal delivery), and Trichimoniasis (02/2017).    She  reports that  has never smoked. she has never used smokeless tobacco. She reports that she drinks alcohol. She reports that she does not use drugs. She  reports that she does not currently engage in sexual activity. She reports using the following method of birth control/protection: None. The patient  has a past surgical history that includes Wisdom tooth extraction; Eye surgery; Vaginal hysterectomy (N/A, 01/07/2015); Bilateral salpingectomy (Bilateral, 01/07/2015); and Abdominal hysterectomy (2016).  Her family history includes Asthma in her father; Breast cancer in her paternal grandmother; Diabetes in her maternal grandmother; Hypertension in her maternal grandmother; Stroke in her mother.  Review of Systems  Constitutional: Negative for chills and fever.  Cardiovascular:  Negative for chest pain.  Genitourinary: Negative for dysuria.  Skin: Negative for itching and rash.  Neurological: Negative for dizziness, tingling, tremors, sensory change, speech change, focal weakness, seizures, loss of consciousness and headaches.    The problem list and medications were reviewed and updated by myself where necessary and exist elsewhere in the encounter.   OBJECTIVE:  BP (!) 174/104   Pulse 73   Temp 97.9 F (36.6 C) (Oral)   Resp 16   Ht 5' 3.25" (1.607 m)   Wt 177 lb 12.8 oz (80.6 kg)   LMP 12/09/2014   SpO2 100%   BMI 31.25 kg/m   Lab Results  Component Value Date   WBC 4.7 04/07/2017   HGB 14.3 04/07/2017   HCT 42.0 04/07/2017   MCV 87.4 04/07/2017   PLT 271 04/07/2017    Lab Results  Component Value Date   CREATININE 0.80 04/07/2017   BUN 6 04/07/2017   NA 139 04/07/2017   K 4.2 04/07/2017   CL 101 04/07/2017   CO2 28 04/07/2017    Lab Results  Component Value Date   ALT 12 (L) 04/07/2017   AST 15 04/07/2017   ALKPHOS 73 04/07/2017   BILITOT 0.6 04/07/2017   Wt Readings from Last 3 Encounters:  06/04/17 177 lb 12.8 oz (80.6 kg)  04/15/17 176 lb 9.6 oz (80.1 kg)  01/29/15 171 lb 9.6 oz (77.8 kg)   Temp Readings from Last 3 Encounters:  06/04/17 97.9 F (36.6 C) (Oral)  04/15/17 98.7  F (37.1 C)  04/07/17 98.2 F (36.8 C) (Oral)   BP Readings from Last 3 Encounters:  06/04/17 (!) 174/104  04/15/17 130/90  04/07/17 (!) 170/107   Pulse Readings from Last 3 Encounters:  06/04/17 73  04/15/17 100  04/07/17 70    Physical Exam  Constitutional: She is oriented to person, place, and time. She is active.  Non-toxic appearance.  Cardiovascular: Normal rate, regular rhythm, S1 normal, S2 normal, normal heart sounds and intact distal pulses. Exam reveals no gallop, no friction rub and no decreased pulses.  No murmur heard. Pulmonary/Chest: Effort normal. No stridor. No tachypnea. No respiratory distress. She has no wheezes. She  has no rales.  Abdominal: She exhibits no distension.  Musculoskeletal: She exhibits no edema.  Neurological: She is alert and oriented to person, place, and time. She has normal reflexes. She displays normal reflexes. No cranial nerve deficit. She exhibits normal muscle tone. Coordination normal.  Skin: Skin is warm and dry. She is not diaphoretic. No pallor.    No results found for this or any previous visit (from the past 72 hour(s)).  No results found.   ASSESSMENT AND PLAN:  Janet Burnett was seen today for hand problem and other.  Diagnoses and all orders for this visit:  Urinary frequency: Previous labs reviewed and they are without abnormality. This is an ongoing problem that she tells me is related to the cleanse. States that she "needs to use the bathroom as often and as long as she wants while at work." Her manager has taken notice.  Unfortunately I do not have a medical diagnosis at this time that would justify her request from an FMLA standpoint. She is not willing to treat her hypertension. I hope she will change her mind with regard to BP treatment. There seems to be a significant level of denial regarding her blood pressure and several misconceptions about general health with regard to home ailments. Will route this note to her primary care physician Dr. Pamella Pert.  I feel badly that I could not truly help this patient today.   Uncontrolled hypertension: Normal CT near the end of 2018.  Last renal panel, CBC, TSH, and urine normal. Most recent EKG normal.      The patient is advised to call or return to clinic if she does not see an improvement in symptoms, or to seek the care of the closest emergency department if she worsens with the above plan.   Philis Fendt, MHS, PA-C [facility] 06/04/2017 12:32 PM

## 2017-06-28 ENCOUNTER — Ambulatory Visit: Payer: BLUE CROSS/BLUE SHIELD | Admitting: Emergency Medicine

## 2017-06-28 ENCOUNTER — Encounter: Payer: Self-pay | Admitting: Emergency Medicine

## 2017-06-28 ENCOUNTER — Other Ambulatory Visit: Payer: Self-pay

## 2017-06-28 VITALS — BP 138/74 | HR 78 | Temp 98.9°F | Resp 16 | Ht 63.5 in | Wt 178.8 lb

## 2017-06-28 DIAGNOSIS — M545 Low back pain, unspecified: Secondary | ICD-10-CM | POA: Insufficient documentation

## 2017-06-28 DIAGNOSIS — S39012A Strain of muscle, fascia and tendon of lower back, initial encounter: Secondary | ICD-10-CM | POA: Insufficient documentation

## 2017-06-28 MED ORDER — KETOROLAC TROMETHAMINE 60 MG/2ML IM SOLN
60.0000 mg | Freq: Once | INTRAMUSCULAR | Status: AC
  Administered 2017-06-28: 60 mg via INTRAMUSCULAR

## 2017-06-28 MED ORDER — CYCLOBENZAPRINE HCL 10 MG PO TABS: 10.0000 mg | ORAL_TABLET | Freq: Three times a day (TID) | ORAL | 0 refills | Status: DC | PRN

## 2017-06-28 MED ORDER — DICLOFENAC SODIUM 75 MG PO TBEC: 75.0000 mg | DELAYED_RELEASE_TABLET | Freq: Two times a day (BID) | ORAL | 0 refills | Status: AC

## 2017-06-28 NOTE — Patient Instructions (Addendum)
     IF you received an x-ray today, you will receive an invoice from [facility]. Please contact [facility] at [phone] with questions or concerns regarding your invoice.   IF you received labwork today, you will receive an invoice from [facility]. Please contact [facility] at [phone] with questions or concerns regarding your invoice.   Our billing staff will not be able to assist you with questions regarding bills from these companies.  You will be contacted with the lab results as soon as they are available. The fastest way to get your results is to activate your My Chart account. Instructions are located on the last page of this paperwork. If you have not heard from us regarding the results in 2 weeks, please contact this office.      Back Pain, Adult Back pain is very common. The pain often gets better over time. The cause of back pain is usually not dangerous. Most people can learn to manage their back pain on their own. Follow these instructions at home: Watch your back pain for any changes. The following actions may help to lessen any pain you are feeling:  Stay active. Start with short walks on flat ground if you can. Try to walk farther each day.  Exercise regularly as told by your doctor. Exercise helps your back heal faster. It also helps avoid future injury by keeping your muscles strong and flexible.  Do not sit, drive, or stand in one place for more than 30 minutes.  Do not stay in bed. Resting more than 1-2 days can slow down your recovery.  Be careful when you bend or lift an object. Use good form when lifting: ? Bend at your knees. ? Keep the object close to your body. ? Do not twist.  Sleep on a firm mattress. Lie on your side, and bend your knees. If you lie on your back, put a pillow under your knees.  Take medicines only as told by your doctor.  Put ice on the injured area. ? Put ice in a plastic bag. ? Place a towel between your  skin and the bag. ? Leave the ice on for 20 minutes, 2-3 times a day for the first 2-3 days. After that, you can switch between ice and heat packs.  Avoid feeling anxious or stressed. Find good ways to deal with stress, such as exercise.  Maintain a healthy weight. Extra weight puts stress on your back.  Contact a doctor if:  You have pain that does not go away with rest or medicine.  You have worsening pain that goes down into your legs or buttocks.  You have pain that does not get better in one week.  You have pain at night.  You lose weight.  You have a fever or chills. Get help right away if:  You cannot control when you poop (bowel movement) or pee (urinate).  Your arms or legs feel weak.  Your arms or legs lose feeling (numbness).  You feel sick to your stomach (nauseous) or throw up (vomit).  You have belly (abdominal) pain.  You feel like you may pass out (faint). This information is not intended to replace advice given to you by your health care provider. Make sure you discuss any questions you have with your health care provider. Document Released: 10/27/2007 Document Revised: 10/16/2015 Document Reviewed: 09/11/2013 Elsevier Interactive Patient Education  2018 Elsevier Inc.  

## 2017-06-28 NOTE — Progress Notes (Signed)
Janet Burnett 42 y.o.   Chief Complaint  Patient presents with  . Back Pain    x 4 days - per patient pulled muscle while exercising    HISTORY OF PRESENT ILLNESS: This is a 42 y.o. female complaining of low back pain that started 4 days ago during a jump squat while exercising.  Denies any neurological symptoms to the legs.  Denies any urinary symptoms.  Back Pain  This is a new problem. The current episode started in the past 7 days. The problem occurs constantly. The problem has been waxing and waning since onset. The pain is present in the lumbar spine. The pain does not radiate. The pain is at a severity of 7/10. The pain is moderate. The pain is the same all the time. The symptoms are aggravated by bending and position. Stiffness is present all day. Pertinent negatives include no abdominal pain, bladder incontinence, bowel incontinence, chest pain, dysuria, fever, headaches, leg pain, numbness, paresis, paresthesias, pelvic pain, perianal numbness, tingling, weakness or weight loss. Risk factors: none. Treatments tried: topical Lidocaine. The treatment provided no relief.     Prior to Admission medications   Medication Sig Start Date End Date Taking? Authorizing Provider  BLACK CURRANT SEED OIL PO Take by mouth daily.   Yes [provider]  Multiple Vitamin (MULTIVITAMIN WITH MINERALS) TABS tablet Take 1 tablet by mouth daily.    [provider]    No Known Allergies  Patient Active Problem List   Diagnosis Date Noted  . Fibroid 01/07/2015  . Essential hypertension 09/17/2014  . Anemia due to chronic blood loss 08/06/2014  . History of transfusion 08/06/2014  . Menorrhagia with regular cycle 08/06/2014  . Dysmenorrhea 08/06/2014  . Fibroids 07/25/2014    Past Medical History:  Diagnosis Date  . Blood transfusion without reported diagnosis 05/13/14   In [place], 2 units transfused   . Fibroid   . Hypertension   . SVD (spontaneous vaginal delivery)     x 2  . Trichimoniasis 02/2017    Past Surgical History:  Procedure Laterality Date  . ABDOMINAL HYSTERECTOMY  2016  . BILATERAL SALPINGECTOMY Bilateral 01/07/2015   Procedure: BILATERAL SALPINGECTOMY;  Surgeon: Donnamae Jude, MD;  Location: [facility];  Service: Gynecology;  Laterality: Bilateral;  . 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 VAGINAL HYSTERECTOMY N/A 01/07/2015   Procedure: HYSTERECTOMY VAGINAL;  Surgeon: Donnamae Jude, MD;  Location: [facility];  Service: Gynecology;  Laterality: N/A;  . WISDOM TOOTH EXTRACTION      Social History   Socioeconomic History  . Marital status: Single    Spouse name: Not on file  . Number of children: Not on file  . Years of education: Not on file  . Highest education level: Not on file  Social Needs  . Financial resource strain: Not on file  . Food insecurity - worry: Not on file  . Food insecurity - inability: Not on file  . Transportation needs - medical: Not on file  . Transportation needs - non-medical: Not on file  Occupational History  . Not on file  Tobacco Use  . Smoking status: Never Smoker  . Smokeless tobacco: Never Used  Substance and Sexual Activity  . Alcohol use: Yes    Alcohol/week: 0.0 oz    Comment: [facility]  . Drug use: No  . Sexual activity: Not Currently    Birth control/protection: None  Other Topics Concern  .  Not on file  Social History Narrative  . Not on file    Family History  Problem Relation Age of Onset  . Stroke Mother   . Asthma Father   . Diabetes Maternal Grandmother   . Hypertension Maternal Grandmother   . Breast cancer Paternal Grandmother      Review of Systems  Constitutional: Negative.  Negative for fever and weight loss.  Respiratory: Negative.  Negative for cough and shortness of breath.   Cardiovascular: Negative.  Negative for chest pain and palpitations.  Gastrointestinal: Negative for abdominal pain, blood in stool, bowel incontinence, diarrhea, nausea and vomiting.    Genitourinary: Negative for bladder incontinence, dysuria, hematuria and pelvic pain.  Musculoskeletal: Positive for back pain.  Skin: Negative.  Negative for rash.  Neurological: Negative for tingling, sensory change, focal weakness, weakness, numbness, headaches and paresthesias.  Endo/Heme/Allergies: Negative.   All other systems reviewed and are negative.  Vitals:   06/28/17 0825  BP: 138/74  Pulse: 78  Resp: 16  Temp: 98.9 F (37.2 C)  SpO2: 99%     Physical Exam  Constitutional: She is oriented to person, place, and time. She appears well-developed and well-nourished.  HENT:  Head: Normocephalic and atraumatic.  Eyes: EOM are normal. Pupils are equal, round, and reactive to light.  Neck: Normal range of motion.  Cardiovascular: Normal rate and regular rhythm.  Pulmonary/Chest: Effort normal and breath sounds normal.  Abdominal: Soft. There is no tenderness.  Musculoskeletal:       Lumbar back: She exhibits decreased range of motion, tenderness, pain and spasm. She exhibits no bony tenderness and normal pulse.       Back:  Neurological: She is alert and oriented to person, place, and time. She displays normal reflexes. No sensory deficit. She exhibits normal muscle tone.  Skin: Skin is warm and dry. Capillary refill takes less than 2 seconds. No rash noted.  Psychiatric: She has a normal mood and affect. Her behavior is normal.  Vitals reviewed.    ASSESSMENT & PLAN: Janet Burnett was seen today for back pain.  Diagnoses and all orders for this visit:  Strain of lumbar region, initial encounter  Acute right-sided low back pain without sciatica -     ketorolac (TORADOL) injection 60 mg  Other orders -     cyclobenzaprine (FLEXERIL) 10 MG tablet; Take 1 tablet (10 mg total) by mouth 3 (three) times daily as needed for muscle spasms. -     diclofenac (VOLTAREN) 75 MG EC tablet; Take 1 tablet (75 mg total) by mouth 2 (two) times daily for 5 days.    Patient  Instructions       IF you received an x-ray today, you will receive an invoice from [facility]. Please contact [facility] at [phone] with questions or concerns regarding your invoice.   IF you received labwork today, you will receive an invoice from [facility]. Please contact [facility] at [phone] with questions or concerns regarding your invoice.   Our billing staff will not be able to assist you with questions regarding bills from these companies.  You will be contacted with the lab results as soon as they are available. The fastest way to get your results is to activate your My Chart account. Instructions are located on the last page of this paperwork. If you have not heard from [place] regarding the results in 2 weeks, please contact this office.     Back Pain, Adult Back pain is very common. The pain often  gets better over time. The cause of back pain is usually not dangerous. Most people can learn to manage their back pain on their own. Follow these instructions at home: Watch your back pain for any changes. The following actions may help to lessen any pain you are feeling:  Stay active. Start with short walks on flat ground if you can. Try to walk farther each day.  Exercise regularly as told by your doctor. Exercise helps your back heal faster. It also helps avoid future injury by keeping your muscles strong and flexible.  Do not sit, drive, or stand in one place for more than 30 minutes.  Do not stay in bed. Resting more than 1-2 days can slow down your recovery.  Be careful when you bend or lift an object. Use good form when lifting: ? Bend at your knees. ? Keep the object close to your body. ? Do not twist.  Sleep on a firm mattress. Lie on your side, and bend your knees. If you lie on your back, put a pillow under your knees.  Take medicines only as told by your doctor.  Put ice on the injured area. ? Put ice in a plastic bag. ? Place a towel  between your skin and the bag. ? Leave the ice on for 20 minutes, 2-3 times a day for the first 2-3 days. After that, you can switch between ice and heat packs.  Avoid feeling anxious or stressed. Find good ways to deal with stress, such as exercise.  Maintain a healthy weight. Extra weight puts stress on your back.  Contact a doctor if:  You have pain that does not go away with rest or medicine.  You have worsening pain that goes down into your legs or buttocks.  You have pain that does not get better in one week.  You have pain at night.  You lose weight.  You have a fever or chills. Get help right away if:  You cannot control when you poop (bowel movement) or pee (urinate).  Your arms or legs feel weak.  Your arms or legs lose feeling (numbness).  You feel sick to your stomach (nauseous) or throw up (vomit).  You have belly (abdominal) pain.  You feel like you may pass out (faint). This information is not intended to replace advice given to you by your health care provider. Make sure you discuss any questions you have with your health care provider. Document Released: 10/27/2007 Document Revised: 10/16/2015 Document Reviewed: 09/11/2013 Elsevier Interactive Patient Education  2018 Elsevier Inc.    Agustina Caroli, MD [facility]

## 2017-09-03 DIAGNOSIS — R030 Elevated blood-pressure reading, without diagnosis of hypertension: Secondary | ICD-10-CM | POA: Diagnosis not present

## 2017-09-03 DIAGNOSIS — N760 Acute vaginitis: Secondary | ICD-10-CM | POA: Diagnosis not present

## 2018-06-27 DIAGNOSIS — K047 Periapical abscess without sinus: Secondary | ICD-10-CM | POA: Diagnosis not present

## 2018-11-28 IMAGING — CT CT HEAD W/O CM
3 series · 16 of 45 positions shown, 19 images · non-contrast
Comparison: None.

CLINICAL DATA: Neuro deficits, subacute. Dizziness and
lightheadedness. Unable to concentrate.

EXAM:
CT HEAD WITHOUT CONTRAST
TECHNIQUE: Contiguous axial images were obtained from the base of the skull
through the vertex without intravenous contrast.

[Series 3: head 5.0 h30s · axial · 0.46mm/px · z∈[-148,-33]mm · 10 of 28 slices shown, 13 images]
[im 3/28  brain]
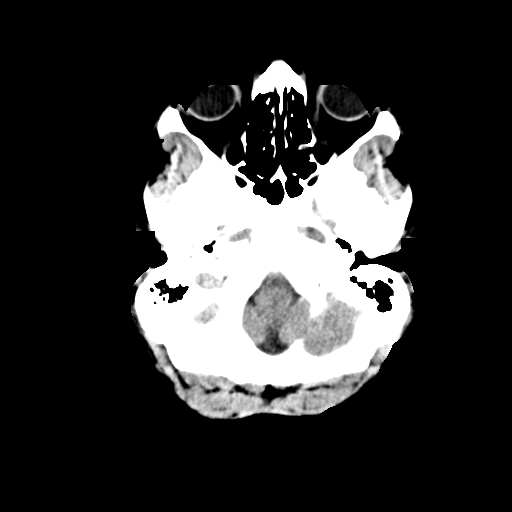
[im 3/28  bone]
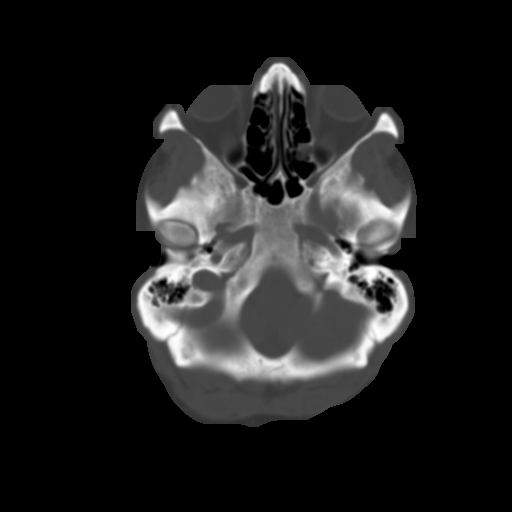
[im 5/28  brain]
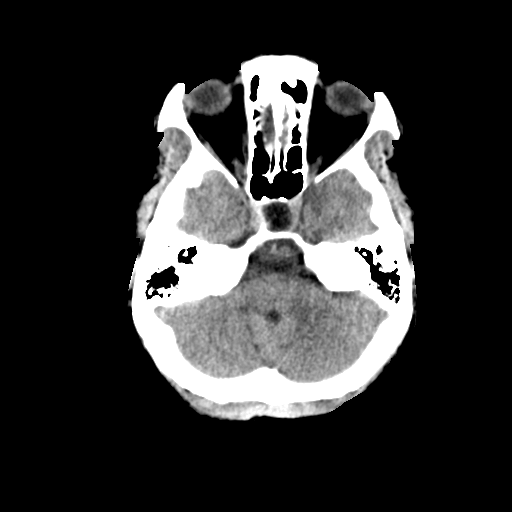
[im 8/28  brain]
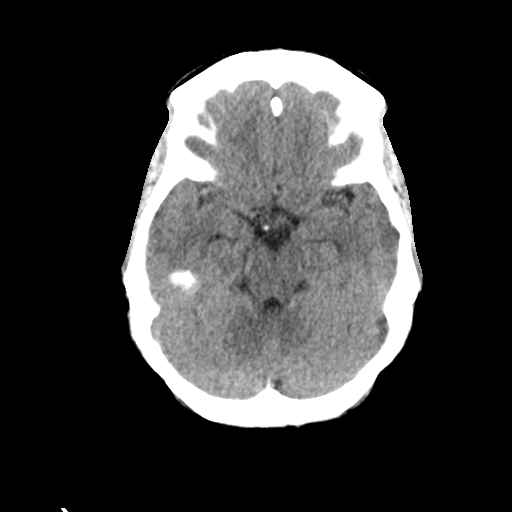
[im 11/28  brain]
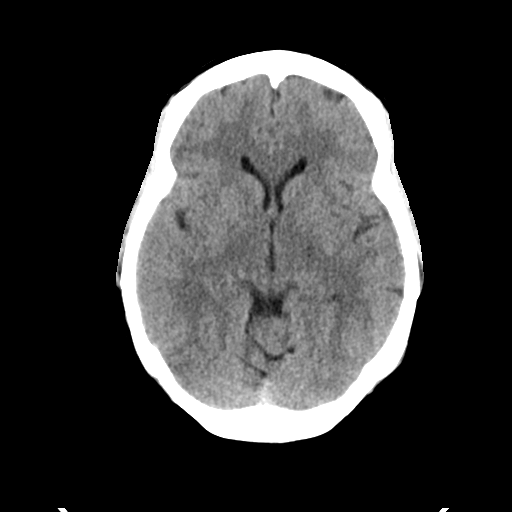
[im 13/28  brain]
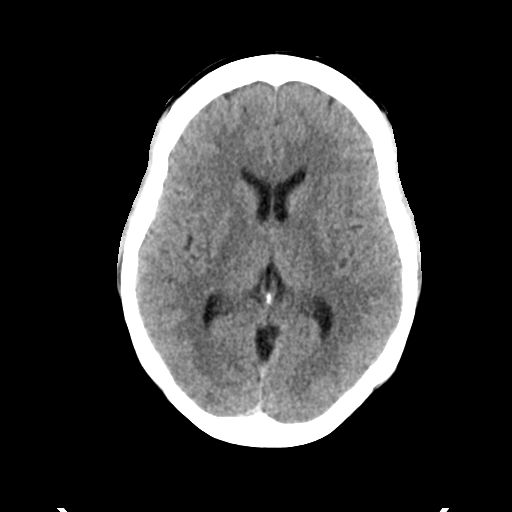
[im 13/28  bone]
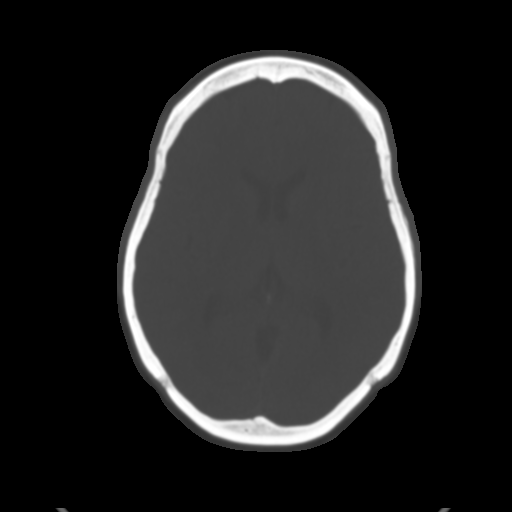
[im 16/28  brain]
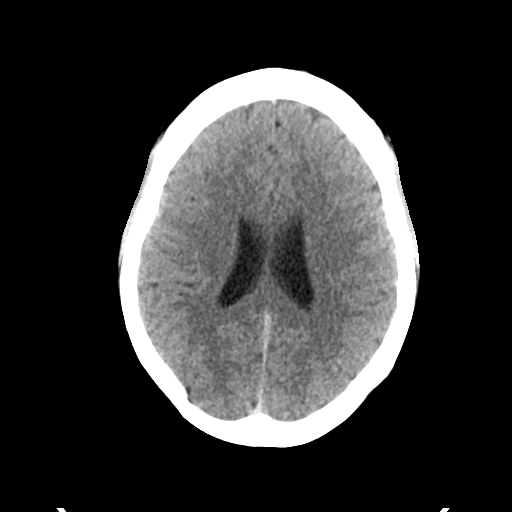
[im 18/28  brain]
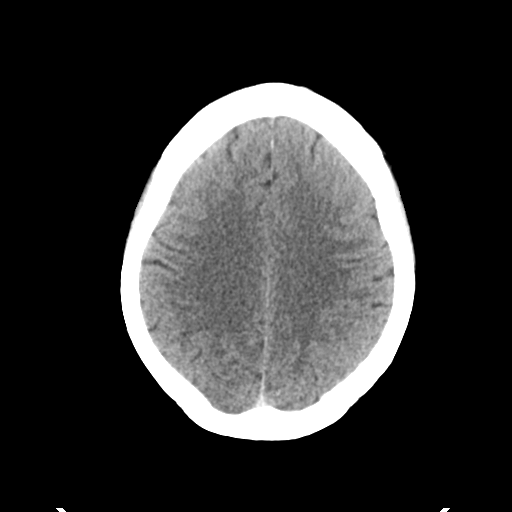
[im 21/28  brain]
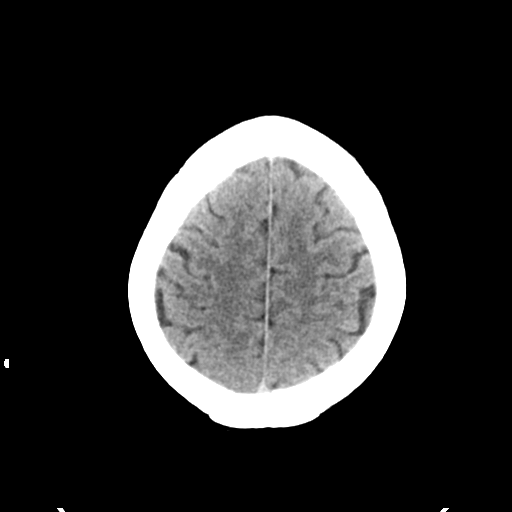
[im 24/28  brain]
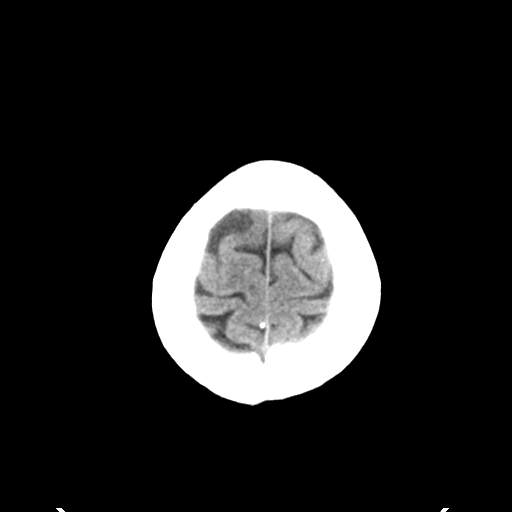
[im 24/28  bone]
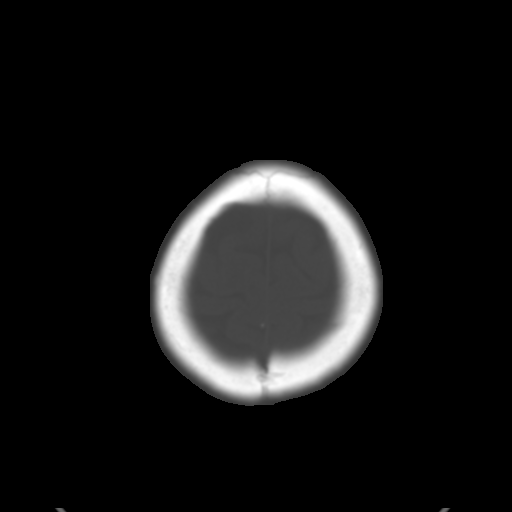
[im 26/28  brain]
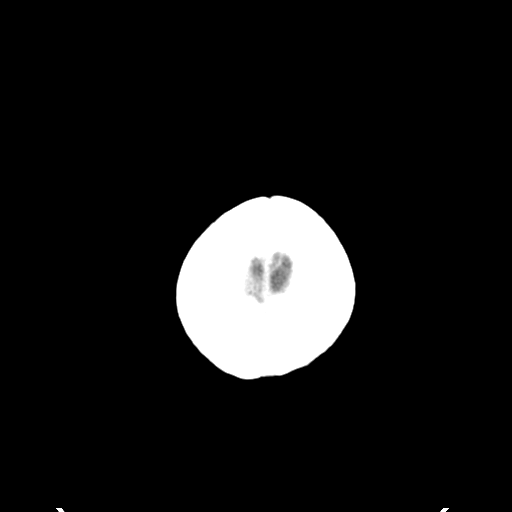

[Series 5: head 3.0 mpr cor · coronal · 0.31mm/px · 3 of 67 slices shown]
[im 23/67  brain]
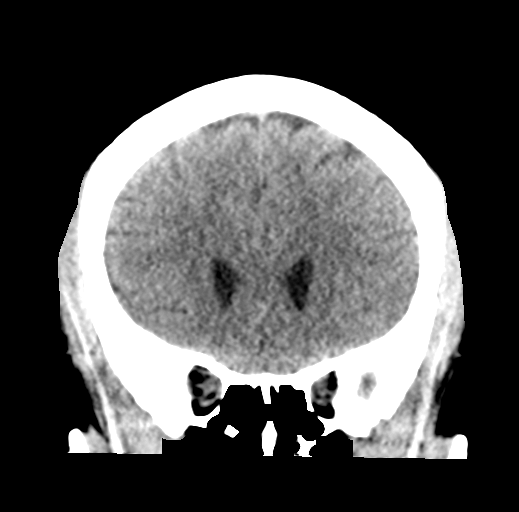
[im 30/67  brain]
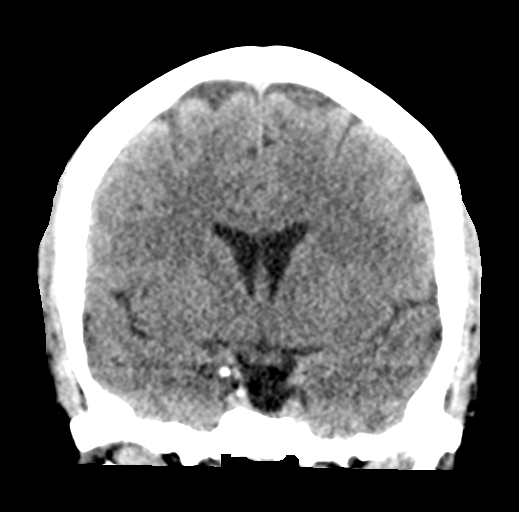
[im 37/67  brain]
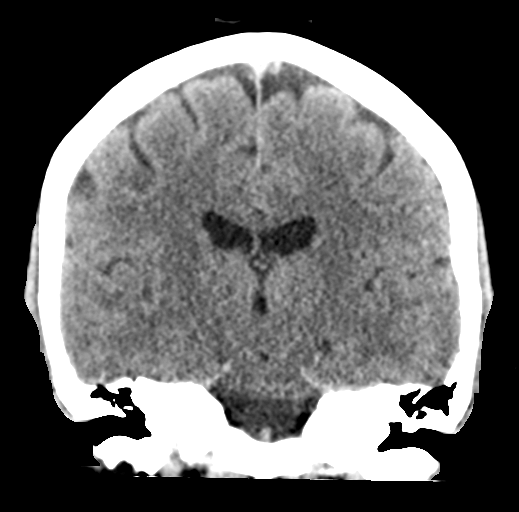

[Series 6: head 3.0 mpr sag · sagittal · 0.31mm/px · 3 of 60 slices shown]
[im 20/60  brain]
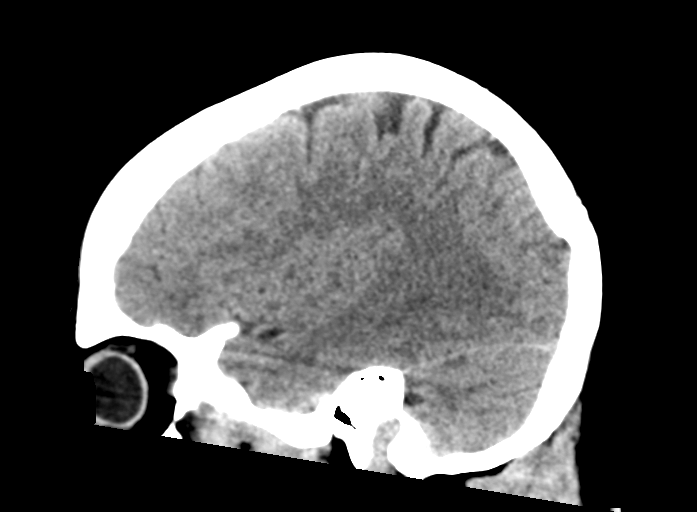
[im 30/60  brain]
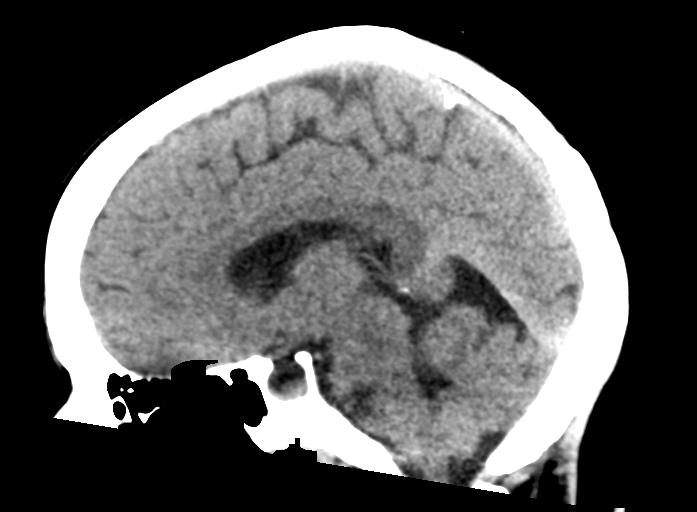
[im 40/60  brain]
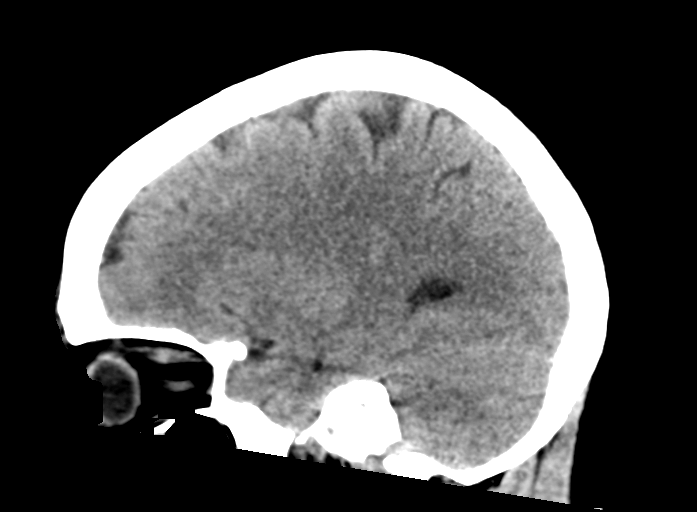

[16 of 45 positions shown; findings below may reference images not displayed]

FINDINGS: Brain: No evidence of acute infarction, hemorrhage, hydrocephalus,
extra-axial collection or mass lesion/mass effect. Partially empty
sella.

Vascular: No hyperdense vessel or unexpected calcification.

Skull: Normal. Negative for fracture or focal lesion.

Sinuses/Orbits: No acute finding.
IMPRESSION: Negative.  No explanation for symptoms.

## 2019-02-07 ENCOUNTER — Other Ambulatory Visit: Payer: Self-pay

## 2019-02-07 ENCOUNTER — Ambulatory Visit (HOSPITAL_COMMUNITY)
Admission: EM | Admit: 2019-02-07 | Discharge: 2019-02-07 | Disposition: A | Discharge status: Home / Self Care | Payer: BC Managed Care – PPO | Attending: Family Medicine | Admitting: Family Medicine

## 2019-02-07 ENCOUNTER — Encounter (HOSPITAL_COMMUNITY): Payer: Self-pay

## 2019-02-07 ENCOUNTER — Other Ambulatory Visit: Payer: Self-pay | Admitting: Family Medicine

## 2019-02-07 DIAGNOSIS — R030 Elevated blood-pressure reading, without diagnosis of hypertension: Secondary | ICD-10-CM | POA: Diagnosis not present

## 2019-02-07 DIAGNOSIS — N898 Other specified noninflammatory disorders of vagina: Secondary | ICD-10-CM | POA: Diagnosis not present

## 2019-02-07 MED ORDER — CEFTRIAXONE SODIUM 250 MG IJ SOLR
250.0000 mg | Freq: Once | INTRAMUSCULAR | Status: AC
  Administered 2019-02-07: 19:00:00 250 mg via INTRAMUSCULAR

## 2019-02-07 MED ORDER — CEFTRIAXONE SODIUM 250 MG IJ SOLR
INTRAMUSCULAR | Status: AC
  Filled 2019-02-07: qty 250

## 2019-02-07 MED ORDER — AZITHROMYCIN 250 MG PO TABS
1000.0000 mg | ORAL_TABLET | Freq: Once | ORAL | Status: AC
  Administered 2019-02-07: 19:00:00 1000 mg via ORAL

## 2019-02-07 MED ORDER — AZITHROMYCIN 250 MG PO TABS
ORAL_TABLET | ORAL | Status: AC
  Filled 2019-02-07: qty 4

## 2019-02-07 NOTE — Discharge Instructions (Addendum)
You have been given the following medications today for treatment of suspected gonorrhea and/or chlamydia:  cefTRIAXone (ROCEPHIN) injection 250 mg azithromycin (ZITHROMAX) tablet 1,000 mg  Even though we have treated you today, we have sent testing for sexually transmitted infections. We will notify you of any positive results once they are received. If required, we will prescribe any medications you might need.  Please refrain from all sexual activity for at least the next seven days.  Also, your blood pressure was noted to be elevated during your visit today. You may return here within the next few days to recheck if unable to see your primary care doctor. If your blood pressure remains persistently elevated, you may need to begin taking a medication.  BP (!) 185/116 (BP Location: Left Arm)

## 2019-02-07 NOTE — ED Provider Notes (Signed)
[place]   [id number] 02/07/19 Arrival Time: [place]:  1. Vaginal discharge   2. Elevated blood pressure reading without diagnosis of hypertension     No signs of PID.   Discharge Instructions     You have been given the following medications today for treatment of suspected gonorrhea and/or chlamydia:  cefTRIAXone (ROCEPHIN) injection 250 mg azithromycin (ZITHROMAX) tablet 1,000 mg  Even though we have treated you today, we have sent testing for sexually transmitted infections. We will notify you of any positive results once they are received. If required, we will prescribe any medications you might need.  Please refrain from all sexual activity for at least the next seven days.  Also, your blood pressure was noted to be elevated during your visit today. You may return here within the next few days to recheck if unable to see your primary care doctor. If your blood pressure remains persistently elevated, you may need to begin taking a medication.  BP (!) 185/116 (BP Location: Left Arm)     Pending: Labs Reviewed  RPR  HIV ANTIBODY (ROUTINE TESTING W REFLEX)  CERVICOVAGINAL ANCILLARY ONLY   Will notify of any positive results. Instructed to refrain from sexual activity for at least seven days.  Reviewed expectations re: course of current medical issues. Questions answered. Outlined signs and symptoms indicating need for more acute intervention. Patient verbalized understanding. After Visit Summary given.   SUBJECTIVE:  Janet Burnett is a 43 y.o. female who presents with complaint of vaginal discharge. Onset abrupt. First noticed approx 4-5 d ago. Describes discharge as thick and yellow/green. Urinary symptoms: denies dysuria or urinary frequency or hematuria. Afebrile. No abdominal or pelvic pain. No n/v. No rashes or lesions. Reports that she is sexually active with single female partner and with regular condom use. "He may have not used  a condom last time." OTC treatment: none. History of STI: none reported.  Patient's last menstrual period was 12/09/2014.  Increased blood pressure noted today. Reports that she has not been treated for hypertension in the past.  She reports no chest pain on exertion, no dyspnea on exertion, no swelling of ankles, no orthostatic dizziness or lightheadedness, no orthopnea or paroxysmal nocturnal dyspnea, no palpitations and no intermittent claudication symptoms.  ROS: As per HPI. All other systems negative.   OBJECTIVE:  Vitals:   02/07/19 1839  BP: (!) 185/116  Pulse: 76  Resp: 15  Temp: 98.2 F (36.8 C)  TempSrc: Temporal  SpO2: 100%     General appearance: alert, cooperative, appears stated age and no distress Throat: lips, mucosa, and tongue normal; teeth and gums normal CV: RRR Lungs: CTAB Back: no CVA tenderness; FROM at waist Abdomen: soft, non-tender GU: deferred Skin: warm and dry Psychological: alert and cooperative; normal mood and affect.   Labs Reviewed - No data to display  No Known Allergies  Past Medical History:  Diagnosis Date  . Blood transfusion without reported diagnosis 05/13/14   In [place], 2 units transfused   . Fibroid   . Hypertension   . SVD (spontaneous vaginal delivery)    x 2  . Trichimoniasis 02/2017   Family History  Problem Relation Age of Onset  . Stroke Mother   . Asthma Father   . Diabetes Maternal Grandmother   . Hypertension Maternal Grandmother   . Breast cancer Paternal Grandmother    Social History   Socioeconomic History  . Marital status: Single    Spouse name: Not  on file  . Number of children: Not on file  . Years of education: Not on file  . Highest education level: Not on file  Occupational History  . Not on file  Social Needs  . Financial resource strain: Not on file  . Food insecurity    Worry: Not on file    Inability: Not on file  . Transportation needs    Medical: Not on file     Non-medical: Not on file  Tobacco Use  . Smoking status: Never Smoker  . Smokeless tobacco: Never Used  Substance and Sexual Activity  . Alcohol use: Yes    Alcohol/week: 0.0 standard drinks    Comment: WINE OCC  . Drug use: No  . Sexual activity: Not Currently    Birth control/protection: None  Lifestyle  . Physical activity    Days per week: Not on file    Minutes per session: Not on file  . Stress: Not on file  Relationships  . Social Herbalist on phone: Not on file    Gets together: Not on file    Attends religious service: Not on file    Active member of club or organization: Not on file    Attends meetings of clubs or organizations: Not on file    Relationship status: Not on file  . Intimate partner violence    Fear of current or ex partner: Not on file    Emotionally abused: Not on file    Physically abused: Not on file    Forced sexual activity: Not on file  Other Topics Concern  . Not on file  Social History Narrative  . Not on file          Vanessa Kick, MD 02/07/19 1901

## 2019-02-07 NOTE — ED Triage Notes (Signed)
Patient presents to Urgent Care with complaints of vaginal discharge with a yellow-green tint since about 5 days ago. Patient reports she thought she was having protected intercourse but she thinks her partner may have lied about actually wearing a condom.

## 2019-02-08 LAB — CERVICOVAGINAL ANCILLARY ONLY
Bacterial Vaginitis (gardnerella): POSITIVE — AB
Candida Glabrata: NEGATIVE
Candida Vaginitis: POSITIVE — AB
Molecular Disclaimer: NEGATIVE
Molecular Disclaimer: NEGATIVE
Molecular Disclaimer: NEGATIVE
Molecular Disclaimer: NORMAL
Trichomonas: POSITIVE — AB

## 2019-02-08 LAB — RPR: RPR Ser Ql: NONREACTIVE

## 2019-02-08 LAB — HIV ANTIBODY (ROUTINE TESTING W REFLEX): HIV Screen 4th Generation wRfx: NONREACTIVE

## 2019-02-09 ENCOUNTER — Telehealth (HOSPITAL_COMMUNITY): Payer: Self-pay | Admitting: Emergency Medicine

## 2019-02-09 LAB — CERVICOVAGINAL ANCILLARY ONLY
Chlamydia: NEGATIVE
Neisseria Gonorrhea: NEGATIVE

## 2019-02-09 MED ORDER — METRONIDAZOLE 500 MG PO TABS: 500.0000 mg | ORAL_TABLET | Freq: Two times a day (BID) | ORAL | 0 refills | Status: AC

## 2019-02-09 MED ORDER — FLUCONAZOLE 150 MG PO TABS: 150.0000 mg | ORAL_TABLET | Freq: Once | ORAL | 0 refills | Status: AC

## 2019-02-09 NOTE — Telephone Encounter (Signed)
Bacterial vaginosis is positive. This was not treated at the urgent care visit.  Flagyl 500 mg BID x 7 days #14 no refills sent to patients pharmacy of choice.    Test for candida (yeast) was positive.  Prescription for fluconazole 150mg po now, repeat dose in 3d if needed, #2 no refills, sent to the pharmacy of record.  Recheck or followup with PCP for further evaluation if symptoms are not improving.    Trichomonas is positive. Rx  for Flagyl was sent to the pharmacy of record. Pt needs education to refrain from sexual intercourse for 7 days to give the medicine time to work. Sexual partners need to be notified and tested/treated. Condoms may reduce risk of reinfection. Recheck for further evaluation if symptoms are not improving.   Patient contacted and made aware of    results, all questions answered   

## 2019-03-12 ENCOUNTER — Ambulatory Visit: Payer: BC Managed Care – PPO | Admitting: Emergency Medicine

## 2019-03-12 ENCOUNTER — Encounter: Payer: Self-pay | Admitting: Emergency Medicine

## 2019-03-12 ENCOUNTER — Other Ambulatory Visit: Payer: Self-pay

## 2019-03-12 VITALS — BP 152/96 | HR 80 | Temp 99.5°F | Ht 64.0 in | Wt 183.4 lb

## 2019-03-12 DIAGNOSIS — I10 Essential (primary) hypertension: Secondary | ICD-10-CM | POA: Diagnosis not present

## 2019-03-12 MED ORDER — AMLODIPINE BESYLATE 5 MG PO TABS: 5.0000 mg | ORAL_TABLET | Freq: Every day | ORAL | 3 refills | Status: DC

## 2019-03-12 NOTE — Progress Notes (Signed)
BP Readings from Last 3 Encounters:  03/12/19 (!) 152/96  02/07/19 (!) 185/116  06/28/17 138/74   Wt Readings from Last 3 Encounters:  03/12/19 183 lb 6.4 oz (83.2 kg)  06/28/17 178 lb 12.8 oz (81.1 kg)  06/04/17 177 lb 12.8 oz (80.6 kg)   Janet Burnett 43 y.o.   Chief Complaint  Patient presents with  . Hypertension  . Weight Gain    concerned about 2 months    HISTORY OF PRESENT ILLNESS: This is a 42 y.o. female here for evaluation of hypertension, on no medications. Blood pressure was high today at dentist's office. Asymptomatic. No other complaints or medical concerns today. States she had a recent blood work done at [facility] urgent care center and it was normal.  HPI   Prior to Admission medications   Medication Sig Start Date End Date Taking? Authorizing Provider  OVER THE COUNTER MEDICATION daily.   Yes [provider]  BLACK CURRANT SEED OIL PO Take by mouth daily.    [provider]  cyclobenzaprine (FLEXERIL) 10 MG tablet Take 1 tablet (10 mg total) by mouth 3 (three) times daily as needed for muscle spasms. Patient not taking: Reported on 03/12/2019 06/28/17   Horald Pollen, MD  Multiple Vitamin (MULTIVITAMIN WITH MINERALS) TABS tablet Take 1 tablet by mouth daily.    [provider]    No Known Allergies  Patient Active Problem List   Diagnosis Date Noted  . Low back pain 06/28/2017  . Strain of lumbar region 06/28/2017  . Fibroid 01/07/2015  . Essential hypertension 09/17/2014  . Anemia due to chronic blood loss 08/06/2014  . History of transfusion 08/06/2014  . Menorrhagia with regular cycle 08/06/2014  . Dysmenorrhea 08/06/2014  . Fibroids 07/25/2014    Past Medical History:  Diagnosis Date  . Blood transfusion without reported diagnosis 05/13/14   In [place], 2 units transfused   . Fibroid   . Hypertension   . SVD (spontaneous vaginal delivery)    x 2  . Trichimoniasis 02/2017    Past Surgical History:   Procedure Laterality Date  . ABDOMINAL HYSTERECTOMY  2016  . BILATERAL SALPINGECTOMY Bilateral 01/07/2015   Procedure: BILATERAL SALPINGECTOMY;  Surgeon: Donnamae Jude, MD;  Location: [facility];  Service: Gynecology;  Laterality: Bilateral;  . 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 VAGINAL HYSTERECTOMY N/A 01/07/2015   Procedure: HYSTERECTOMY VAGINAL;  Surgeon: Donnamae Jude, MD;  Location: [facility];  Service: Gynecology;  Laterality: N/A;  . WISDOM TOOTH EXTRACTION      Social History   Socioeconomic History  . Marital status: Single    Spouse name: Not on file  . Number of children: Not on file  . Years of education: Not on file  . Highest education level: Not on file  Occupational History  . Not on file  Social Needs  . Financial resource strain: Not on file  . Food insecurity    Worry: Not on file    Inability: Not on file  . Transportation needs    Medical: Not on file    Non-medical: Not on file  Tobacco Use  . Smoking status: Never Smoker  . Smokeless tobacco: Never Used  Substance and Sexual Activity  . Alcohol use: Yes    Alcohol/week: 0.0 standard drinks    Comment: WINE OCC  . Drug use: No  . Sexual activity: Not Currently    Birth control/protection: None  Lifestyle  .  Physical activity    Days per week: Not on file    Minutes per session: Not on file  . Stress: Not on file  Relationships  . Social Herbalist on phone: Not on file    Gets together: Not on file    Attends religious service: Not on file    Active member of club or organization: Not on file    Attends meetings of clubs or organizations: Not on file    Relationship status: Not on file  . Intimate partner violence    Fear of current or ex partner: Not on file    Emotionally abused: Not on file    Physically abused: Not on file    Forced sexual activity: Not on file  Other Topics Concern  . Not on file  Social History Narrative  . Not on file    Family History  Problem  Relation Age of Onset  . Stroke Mother   . Asthma Father   . Diabetes Maternal Grandmother   . Hypertension Maternal Grandmother   . Breast cancer Paternal Grandmother      Review of Systems  Constitutional: Negative.  Negative for chills and fever.       Weight gain  HENT: Negative.  Negative for congestion and sore throat.   Eyes: Negative.   Respiratory: Negative.  Negative for cough and shortness of breath.   Cardiovascular: Negative.  Negative for chest pain and palpitations.  Gastrointestinal: Negative.  Negative for abdominal pain, diarrhea, nausea and vomiting.  Genitourinary: Negative.  Negative for flank pain and hematuria.  Skin: Negative.  Negative for rash.  Neurological: Negative.  Negative for dizziness and headaches.  Endo/Heme/Allergies: Negative.   All other systems reviewed and are negative.   Vitals:   03/12/19 1544  BP: (!) 152/96  Pulse: 80  Temp: 99.5 F (37.5 C)    Physical Exam Vitals signs reviewed.  Constitutional:      Appearance: Normal appearance.  HENT:     Head: Normocephalic.  Eyes:     Extraocular Movements: Extraocular movements intact.     Conjunctiva/sclera: Conjunctivae normal.     Pupils: Pupils are equal, round, and reactive to light.  Neck:     Musculoskeletal: Normal range of motion.  Cardiovascular:     Rate and Rhythm: Normal rate and regular rhythm.     Pulses: Normal pulses.     Heart sounds: Normal heart sounds.  Pulmonary:     Effort: Pulmonary effort is normal.     Breath sounds: Normal breath sounds.  Musculoskeletal: Normal range of motion.  Skin:    General: Skin is warm and dry.     Capillary Refill: Capillary refill takes less than 2 seconds.  Neurological:     General: No focal deficit present.     Mental Status: She is alert and oriented to person, place, and time.  Psychiatric:        Mood and Affect: Mood normal.        Behavior: Behavior normal.    A total of 25 minutes was spent in the room with  the patient, greater than 50% of which was in counseling/coordination of care regarding hypertension, treatment, diet and nutrition, medication side effects, importance of physical exercise, cardiovascular risks associated with uncontrolled hypertension, home blood pressure monitoring, prognosis, and follow-up.   ASSESSMENT & PLAN: Giahna was seen today for hypertension and weight gain.  Diagnoses and all orders for this visit:  Essential hypertension -  amLODipine (NORVASC) 5 MG tablet; Take 1 tablet (5 mg total) by mouth daily.    Patient Instructions       If you have lab work done today you will be contacted with your lab results within the next 2 weeks.  If you have not heard from [place] then please contact us. The fastest way to get your results is to register for My Chart.   IF you received an x-ray today, you will receive an invoice from [facility]. Please contact [facility] at [phone] with questions or concerns regarding your invoice.   IF you received labwork today, you will receive an invoice from [facility]. Please contact [facility] at [phone] with questions or concerns regarding your invoice.   Our billing staff will not be able to assist you with questions regarding bills from these companies.  You will be contacted with the lab results as soon as they are available. The fastest way to get your results is to activate your My Chart account. Instructions are located on the last page of this paperwork. If you have not heard from [place] regarding the results in 2 weeks, please contact this office.     Hypertension, Adult High blood pressure (hypertension) is when the force of blood pumping through the arteries is too strong. The arteries are the blood vessels that carry blood from the heart throughout the body. Hypertension forces the heart to work harder to pump blood and may cause arteries to become narrow or stiff. Untreated or uncontrolled  hypertension can cause a heart attack, heart failure, a stroke, kidney disease, and other problems. A blood pressure reading consists of a higher number over a lower number. Ideally, your blood pressure should be below 120/80. The first ("top") number is called the systolic pressure. It is a measure of the pressure in your arteries as your heart beats. The second ("bottom") number is called the diastolic pressure. It is a measure of the pressure in your arteries as the heart relaxes. What are the causes? The exact cause of this condition is not known. There are some conditions that result in or are related to high blood pressure. What increases the risk? Some risk factors for high blood pressure are under your control. The following factors may make you more likely to develop this condition:  Smoking.  Having type 2 diabetes mellitus, high cholesterol, or both.  Not getting enough exercise or physical activity.  Being overweight.  Having too much fat, sugar, calories, or salt (sodium) in your diet.  Drinking too much alcohol. Some risk factors for high blood pressure may be difficult or impossible to change. Some of these factors include:  Having chronic kidney disease.  Having a family history of high blood pressure.  Age. Risk increases with age.  Race. You may be at higher risk if you are African American.  Gender. Men are at higher risk than women before age 80. After age 18, women are at higher risk than men.  Having obstructive sleep apnea.  Stress. What are the signs or symptoms? High blood pressure may not cause symptoms. Very high blood pressure (hypertensive crisis) may cause:  Headache.  Anxiety.  Shortness of breath.  Nosebleed.  Nausea and vomiting.  Vision changes.  Severe chest pain.  Seizures. How is this diagnosed? This condition is diagnosed by measuring your blood pressure while you are seated, with your arm resting on a flat surface, your legs  uncrossed, and your feet flat on the floor. The  cuff of the blood pressure monitor will be placed directly against the skin of your upper arm at the level of your heart. It should be measured at least twice using the same arm. Certain conditions can cause a difference in blood pressure between your right and left arms. Certain factors can cause blood pressure readings to be lower or higher than normal for a short period of time:  When your blood pressure is higher when you are in a health care provider's office than when you are at home, this is called white coat hypertension. Most people with this condition do not need medicines.  When your blood pressure is higher at home than when you are in a health care provider's office, this is called masked hypertension. Most people with this condition may need medicines to control blood pressure. If you have a high blood pressure reading during one visit or you have normal blood pressure with other risk factors, you may be asked to:  Return on a different day to have your blood pressure checked again.  Monitor your blood pressure at home for 1 week or longer. If you are diagnosed with hypertension, you may have other blood or imaging tests to help your health care provider understand your overall risk for other conditions. How is this treated? This condition is treated by making healthy lifestyle changes, such as eating healthy foods, exercising more, and reducing your alcohol intake. Your health care provider may prescribe medicine if lifestyle changes are not enough to get your blood pressure under control, and if:  Your systolic blood pressure is above 130.  Your diastolic blood pressure is above 80. Your personal target blood pressure may vary depending on your medical conditions, your age, and other factors. Follow these instructions at home: Eating and drinking   Eat a diet that is high in fiber and potassium, and low in sodium, added sugar, and  fat. An example eating plan is called the DASH (Dietary Approaches to Stop Hypertension) diet. To eat this way: ? Eat plenty of fresh fruits and vegetables. Try to fill one half of your plate at each meal with fruits and vegetables. ? Eat whole grains, such as whole-wheat pasta, brown rice, or whole-grain bread. Fill about one fourth of your plate with whole grains. ? Eat or drink low-fat dairy products, such as skim milk or low-fat yogurt. ? Avoid fatty cuts of meat, processed or cured meats, and poultry with skin. Fill about one fourth of your plate with lean proteins, such as fish, chicken without skin, beans, eggs, or tofu. ? Avoid pre-made and processed foods. These tend to be higher in sodium, added sugar, and fat.  Reduce your daily sodium intake. Most people with hypertension should eat less than 1,500 mg of sodium a day.  Do not drink alcohol if: ? Your health care provider tells you not to drink. ? You are pregnant, may be pregnant, or are planning to become pregnant.  If you drink alcohol: ? Limit how much you use to:  0-1 drink a day for women.  0-2 drinks a day for men. ? Be aware of how much alcohol is in your drink. In the [place] one drink equals one 12 oz bottle of beer (355 mL), one 5 oz glass of wine (148 mL), or one 1 oz glass of hard liquor (44 mL). Lifestyle   Work with your health care provider to maintain a healthy body weight or to lose weight. Ask what an ideal weight  is for you.  Get at least 30 minutes of exercise most days of the week. Activities may include walking, swimming, or biking.  Include exercise to strengthen your muscles (resistance exercise), such as Pilates or lifting weights, as part of your weekly exercise routine. Try to do these types of exercises for 30 minutes at least 3 days a week.  Do not use any products that contain nicotine or tobacco, such as cigarettes, e-cigarettes, and chewing tobacco. If you need help quitting, ask your health  care provider.  Monitor your blood pressure at home as told by your health care provider.  Keep all follow-up visits as told by your health care provider. This is important. Medicines  Take over-the-counter and prescription medicines only as told by your health care provider. Follow directions carefully. Blood pressure medicines must be taken as prescribed.  Do not skip doses of blood pressure medicine. Doing this puts you at risk for problems and can make the medicine less effective.  Ask your health care provider about side effects or reactions to medicines that you should watch for. Contact a health care provider if you:  Think you are having a reaction to a medicine you are taking.  Have headaches that keep coming back (recurring).  Feel dizzy.  Have swelling in your ankles.  Have trouble with your vision. Get help right away if you:  Develop a severe headache or confusion.  Have unusual weakness or numbness.  Feel faint.  Have severe pain in your chest or abdomen.  Vomit repeatedly.  Have trouble breathing. Summary  Hypertension is when the force of blood pumping through your arteries is too strong. If this condition is not controlled, it may put you at risk for serious complications.  Your personal target blood pressure may vary depending on your medical conditions, your age, and other factors. For most people, a normal blood pressure is less than 120/80.  Hypertension is treated with lifestyle changes, medicines, or a combination of both. Lifestyle changes include losing weight, eating a healthy, low-sodium diet, exercising more, and limiting alcohol. This information is not intended to replace advice given to you by your health care provider. Make sure you discuss any questions you have with your health care provider. Document Released: 05/10/2005 Document Revised: 01/18/2018 Document Reviewed: 01/18/2018 Elsevier Patient Education  2020 [employer]       Agustina Caroli, MD [facility]

## 2019-03-12 NOTE — Patient Instructions (Addendum)
   If you have lab work done today you will be contacted with your lab results within the next 2 weeks.  If you have not heard from us then please contact us. The fastest way to get your results is to register for My Chart.   IF you received an x-ray today, you will receive an invoice from [facility]. Please contact [facility] at [phone] with questions or concerns regarding your invoice.   IF you received labwork today, you will receive an invoice from [facility]. Please contact [facility] at [phone] with questions or concerns regarding your invoice.   Our billing staff will not be able to assist you with questions regarding bills from these companies.  You will be contacted with the lab results as soon as they are available. The fastest way to get your results is to activate your My Chart account. Instructions are located on the last page of this paperwork. If you have not heard from us regarding the results in 2 weeks, please contact this office.     Hypertension, Adult High blood pressure (hypertension) is when the force of blood pumping through the arteries is too strong. The arteries are the blood vessels that carry blood from the heart throughout the body. Hypertension forces the heart to work harder to pump blood and may cause arteries to become narrow or stiff. Untreated or uncontrolled hypertension can cause a heart attack, heart failure, a stroke, kidney disease, and other problems. A blood pressure reading consists of a higher number over a lower number. Ideally, your blood pressure should be below 120/80. The first ("top") number is called the systolic pressure. It is a measure of the pressure in your arteries as your heart beats. The second ("bottom") number is called the diastolic pressure. It is a measure of the pressure in your arteries as the heart relaxes. What are the causes? The exact cause of this condition is not known. There are some conditions  that result in or are related to high blood pressure. What increases the risk? Some risk factors for high blood pressure are under your control. The following factors may make you more likely to develop this condition:  Smoking.  Having type 2 diabetes mellitus, high cholesterol, or both.  Not getting enough exercise or physical activity.  Being overweight.  Having too much fat, sugar, calories, or salt (sodium) in your diet.  Drinking too much alcohol. Some risk factors for high blood pressure may be difficult or impossible to change. Some of these factors include:  Having chronic kidney disease.  Having a family history of high blood pressure.  Age. Risk increases with age.  Race. You may be at higher risk if you are African American.  Gender. Men are at higher risk than women before age 45. After age 65, women are at higher risk than men.  Having obstructive sleep apnea.  Stress. What are the signs or symptoms? High blood pressure may not cause symptoms. Very high blood pressure (hypertensive crisis) may cause:  Headache.  Anxiety.  Shortness of breath.  Nosebleed.  Nausea and vomiting.  Vision changes.  Severe chest pain.  Seizures. How is this diagnosed? This condition is diagnosed by measuring your blood pressure while you are seated, with your arm resting on a flat surface, your legs uncrossed, and your feet flat on the floor. The cuff of the blood pressure monitor will be placed directly against the skin of your upper arm at the level of your heart.   It should be measured at least twice using the same arm. Certain conditions can cause a difference in blood pressure between your right and left arms. Certain factors can cause blood pressure readings to be lower or higher than normal for a short period of time:  When your blood pressure is higher when you are in a health care provider's office than when you are at home, this is called white coat hypertension.  Most people with this condition do not need medicines.  When your blood pressure is higher at home than when you are in a health care provider's office, this is called masked hypertension. Most people with this condition may need medicines to control blood pressure. If you have a high blood pressure reading during one visit or you have normal blood pressure with other risk factors, you may be asked to:  Return on a different day to have your blood pressure checked again.  Monitor your blood pressure at home for 1 week or longer. If you are diagnosed with hypertension, you may have other blood or imaging tests to help your health care provider understand your overall risk for other conditions. How is this treated? This condition is treated by making healthy lifestyle changes, such as eating healthy foods, exercising more, and reducing your alcohol intake. Your health care provider may prescribe medicine if lifestyle changes are not enough to get your blood pressure under control, and if:  Your systolic blood pressure is above 130.  Your diastolic blood pressure is above 80. Your personal target blood pressure may vary depending on your medical conditions, your age, and other factors. Follow these instructions at home: Eating and drinking   Eat a diet that is high in fiber and potassium, and low in sodium, added sugar, and fat. An example eating plan is called the DASH (Dietary Approaches to Stop Hypertension) diet. To eat this way: ? Eat plenty of fresh fruits and vegetables. Try to fill one half of your plate at each meal with fruits and vegetables. ? Eat whole grains, such as whole-wheat pasta, brown rice, or whole-grain bread. Fill about one fourth of your plate with whole grains. ? Eat or drink low-fat dairy products, such as skim milk or low-fat yogurt. ? Avoid fatty cuts of meat, processed or cured meats, and poultry with skin. Fill about one fourth of your plate with lean proteins, such  as fish, chicken without skin, beans, eggs, or tofu. ? Avoid pre-made and processed foods. These tend to be higher in sodium, added sugar, and fat.  Reduce your daily sodium intake. Most people with hypertension should eat less than 1,500 mg of sodium a day.  Do not drink alcohol if: ? Your health care provider tells you not to drink. ? You are pregnant, may be pregnant, or are planning to become pregnant.  If you drink alcohol: ? Limit how much you use to:  0-1 drink a day for women.  0-2 drinks a day for men. ? Be aware of how much alcohol is in your drink. In the [place] one drink equals one 12 oz bottle of beer (355 mL), one 5 oz glass of wine (148 mL), or one 1 oz glass of hard liquor (44 mL). Lifestyle   Work with your health care provider to maintain a healthy body weight or to lose weight. Ask what an ideal weight is for you.  Get at least 30 minutes of exercise most days of the week. Activities may include walking, swimming, or   biking.  Include exercise to strengthen your muscles (resistance exercise), such as Pilates or lifting weights, as part of your weekly exercise routine. Try to do these types of exercises for 30 minutes at least 3 days a week.  Do not use any products that contain nicotine or tobacco, such as cigarettes, e-cigarettes, and chewing tobacco. If you need help quitting, ask your health care provider.  Monitor your blood pressure at home as told by your health care provider.  Keep all follow-up visits as told by your health care provider. This is important. Medicines  Take over-the-counter and prescription medicines only as told by your health care provider. Follow directions carefully. Blood pressure medicines must be taken as prescribed.  Do not skip doses of blood pressure medicine. Doing this puts you at risk for problems and can make the medicine less effective.  Ask your health care provider about side effects or reactions to medicines that you  should watch for. Contact a health care provider if you:  Think you are having a reaction to a medicine you are taking.  Have headaches that keep coming back (recurring).  Feel dizzy.  Have swelling in your ankles.  Have trouble with your vision. Get help right away if you:  Develop a severe headache or confusion.  Have unusual weakness or numbness.  Feel faint.  Have severe pain in your chest or abdomen.  Vomit repeatedly.  Have trouble breathing. Summary  Hypertension is when the force of blood pumping through your arteries is too strong. If this condition is not controlled, it may put you at risk for serious complications.  Your personal target blood pressure may vary depending on your medical conditions, your age, and other factors. For most people, a normal blood pressure is less than 120/80.  Hypertension is treated with lifestyle changes, medicines, or a combination of both. Lifestyle changes include losing weight, eating a healthy, low-sodium diet, exercising more, and limiting alcohol. This information is not intended to replace advice given to you by your health care provider. Make sure you discuss any questions you have with your health care provider. Document Released: 05/10/2005 Document Revised: 01/18/2018 Document Reviewed: 01/18/2018 Elsevier Patient Education  2020 [employer]  

## 2019-06-11 ENCOUNTER — Ambulatory Visit: Payer: Self-pay | Admitting: Emergency Medicine

## 2019-06-12 ENCOUNTER — Ambulatory Visit: Payer: BC Managed Care – PPO | Admitting: Emergency Medicine

## 2019-07-17 ENCOUNTER — Other Ambulatory Visit: Payer: Self-pay | Admitting: *Deleted

## 2019-07-17 DIAGNOSIS — I10 Essential (primary) hypertension: Secondary | ICD-10-CM

## 2019-07-17 MED ORDER — AMLODIPINE BESYLATE 5 MG PO TABS: 5.0000 mg | ORAL_TABLET | Freq: Every day | ORAL | 0 refills | Status: DC

## 2019-07-17 NOTE — Addendum Note (Signed)
Addended by: Alfredia Ferguson A on: 07/17/2019 05:54 PM   Modules accepted: Orders

## 2019-10-31 ENCOUNTER — Other Ambulatory Visit: Payer: Self-pay | Admitting: Emergency Medicine

## 2019-10-31 ENCOUNTER — Telehealth: Payer: Self-pay | Admitting: Emergency Medicine

## 2019-10-31 DIAGNOSIS — I10 Essential (primary) hypertension: Secondary | ICD-10-CM

## 2019-10-31 NOTE — Telephone Encounter (Signed)
Requested Prescriptions  Pending Prescriptions Disp Refills   amLODipine (NORVASC) 5 MG tablet [Pharmacy Med Name: AMLODIPINE TAB 5MG ] 30 tablet 0    Sig: TAKE 1 TABLET DAILY     Cardiovascular:  Calcium Channel Blockers Failed - 10/31/2019  1:14 PM      Failed - Last BP in normal range    BP Readings from Last 1 Encounters:  03/12/19 (!) 152/96         Failed - Valid encounter within last 6 months    Recent Outpatient Visits          7 months ago Essential hypertension   [facility], Ines Bloomer, MD   2 years ago Strain of lumbar region, initial encounter   [facility], Ines Bloomer, MD   2 years ago Urinary frequency   [facility], Audrie Lia, PA-C   2 years ago Encounter for medical examination to establish care   [facility], Lilia Argue, MD   5 years ago Essential hypertension   [facility] at [facility], [place], [place]             Phone call to pt.  Advised she is overdue for office visit.  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  Refilled #30 as a courtesy, at this time.

## 2019-11-07 ENCOUNTER — Telehealth: Payer: Self-pay | Admitting: *Deleted

## 2019-11-07 NOTE — Telephone Encounter (Signed)
Patient called spoke to [facility] (clerical) to say she needed to make an appointment to see provider today. When she was at the dentist today her blood pressure was 160/106 and was unable to have dental work. The bp was rechecked and it was 150/110. Patient was unable to schedule appointment for today no open slots available. Patient was advised to go to the Urgent Care or ED, patient has an appointment 11/12/2019 with Dr Mitchel Honour.

## 2019-11-12 ENCOUNTER — Ambulatory Visit: Payer: 59 | Admitting: Emergency Medicine

## 2019-11-15 ENCOUNTER — Other Ambulatory Visit: Payer: Self-pay

## 2019-11-15 ENCOUNTER — Telehealth: Payer: Self-pay | Admitting: Emergency Medicine

## 2019-11-15 ENCOUNTER — Ambulatory Visit (INDEPENDENT_AMBULATORY_CARE_PROVIDER_SITE_OTHER): Payer: No Typology Code available for payment source | Admitting: Emergency Medicine

## 2019-11-15 ENCOUNTER — Encounter: Payer: Self-pay | Admitting: Emergency Medicine

## 2019-11-15 VITALS — BP 138/90 | HR 80 | Temp 98.9°F | Ht 65.0 in | Wt 197.8 lb

## 2019-11-15 DIAGNOSIS — I10 Essential (primary) hypertension: Secondary | ICD-10-CM | POA: Diagnosis not present

## 2019-11-15 MED ORDER — LISINOPRIL-HYDROCHLOROTHIAZIDE 20-12.5 MG PO TABS: 1.0000 | ORAL_TABLET | Freq: Every day | ORAL | 3 refills | Status: AC

## 2019-11-15 MED ORDER — AMLODIPINE BESYLATE 5 MG PO TABS: 5.0000 mg | ORAL_TABLET | Freq: Every day | ORAL | 3 refills | Status: DC

## 2019-11-15 NOTE — Progress Notes (Signed)
Janet Burnett 44 y.o.   Chief Complaint  Patient presents with  . Hypertension    wants to talk about another BP medication   . Stress  . night mears    weird dreams     HISTORY OF PRESENT ILLNESS: This is a 44 y.o. female with history of hypertension here for follow-up.  Presently on amlodipine 5 mg daily.  Blood pressure readings at home 150s over 100. BP Readings from Last 3 Encounters:  11/15/19 138/90  03/12/19 (!) 152/96  02/07/19 (!) 185/116  Also complaining of high stress situation at home with neighbor's situation.  Moved to this new place 1 year ago. For the past week has been having occasional nightmares and weird dreams. No other complaints or medical concerns today.  HPI   Prior to Admission medications   Medication Sig Start Date End Date Taking? Authorizing Provider  amLODipine (NORVASC) 5 MG tablet TAKE 1 TABLET DAILY 10/31/19  Yes Kelbie Moro, Ines Bloomer, MD  Biotin w/ Vitamins C & E (HAIR SKIN & NAILS GUMMIES PO) Take by mouth.   Yes [provider]  Multiple Vitamin (MULTIVITAMIN WITH MINERALS) TABS tablet Take 1 tablet by mouth daily.   Yes [provider]  Probiotic Product (PROBIOTIC DAILY PO) Take by mouth.   Yes [provider]  vitamin B-12 (CYANOCOBALAMIN) 1000 MCG tablet Take 1,000 mcg by mouth daily.   Yes [provider]  BLACK CURRANT SEED OIL PO Take by mouth daily. Patient not taking: Reported on 11/15/2019    [provider]    No Known Allergies  Patient Active Problem List   Diagnosis Date Noted  . Low back pain 06/28/2017  . Strain of lumbar region 06/28/2017  . Fibroid 01/07/2015  . Essential hypertension 09/17/2014  . Anemia due to chronic blood loss 08/06/2014  . History of transfusion 08/06/2014  . Menorrhagia with regular cycle 08/06/2014  . Dysmenorrhea 08/06/2014  . Fibroids 07/25/2014    Past Medical History:  Diagnosis Date  . Blood transfusion without reported diagnosis  05/13/14   In [place], 2 units transfused   . Fibroid   . Hypertension   . SVD (spontaneous vaginal delivery)    x 2  . Trichimoniasis 02/2017    Past Surgical History:  Procedure Laterality Date  . ABDOMINAL HYSTERECTOMY  2016  . BILATERAL SALPINGECTOMY Bilateral 01/07/2015   Procedure: BILATERAL SALPINGECTOMY;  Surgeon: Donnamae Jude, MD;  Location: [facility];  Service: Gynecology;  Laterality: Bilateral;  . 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 VAGINAL HYSTERECTOMY N/A 01/07/2015   Procedure: HYSTERECTOMY VAGINAL;  Surgeon: Donnamae Jude, MD;  Location: [facility];  Service: Gynecology;  Laterality: N/A;  . WISDOM TOOTH EXTRACTION      Social History   Socioeconomic History  . Marital status: Single    Spouse name: Not on file  . Number of children: Not on file  . Years of education: Not on file  . Highest education level: Not on file  Occupational History  . Not on file  Tobacco Use  . Smoking status: Never Smoker  . Smokeless tobacco: Never Used  Vaping Use  . Vaping Use: Never used  Substance and Sexual Activity  . Alcohol use: Yes    Alcohol/week: 0.0 standard drinks    Comment: WINE OCC  . Drug use: No  . Sexual activity: Not Currently    Birth control/protection: None  Other Topics Concern  . Not on  file  Social History Narrative  . Not on file   Social Determinants of Health   Financial Resource Strain:   . Difficulty of Paying Living Expenses:   Food Insecurity:   . Worried About Charity fundraiser in the Last Year:   . Arboriculturist in the Last Year:   Transportation Needs:   . Film/video editor (Medical):   Marland Kitchen Lack of Transportation (Non-Medical):   Physical Activity:   . Days of Exercise per Week:   . Minutes of Exercise per Session:   Stress:   . Feeling of Stress :   Social Connections:   . Frequency of Communication with Friends and Family:   . Frequency of Social Gatherings with Friends and Family:   . Attends Religious  Services:   . Active Member of Clubs or Organizations:   . Attends Archivist Meetings:   Marland Kitchen Marital Status:   Intimate Partner Violence:   . Fear of Current or Ex-Partner:   . Emotionally Abused:   Marland Kitchen Physically Abused:   . Sexually Abused:     Family History  Problem Relation Age of Onset  . Stroke Mother   . Asthma Father   . Diabetes Maternal Grandmother   . Hypertension Maternal Grandmother   . Breast cancer Paternal Grandmother      Review of Systems  Constitutional: Negative.  Negative for chills and fever.  HENT: Negative.  Negative for congestion and sore throat.   Respiratory: Negative.  Negative for cough and shortness of breath.   Cardiovascular: Negative.  Negative for chest pain, palpitations and leg swelling.  Gastrointestinal: Positive for constipation. Negative for abdominal pain, diarrhea, nausea and vomiting.  Genitourinary: Negative.  Negative for dysuria and hematuria.  Skin: Negative.  Negative for rash.  Neurological: Negative.  Negative for dizziness and headaches.  All other systems reviewed and are negative.   Today's Vitals   11/15/19 1140 11/15/19 1146  BP: (!) 153/102 138/90  Pulse: 80   Temp: 98.9 F (37.2 C)   TempSrc: Temporal   SpO2: 95%   Weight: 197 lb 12.8 oz (89.7 kg)   Height: 5\' 5"  (1.651 m)    Body mass index is 32.92 kg/m.  Physical Exam Vitals reviewed.  Constitutional:      Appearance: Normal appearance.  HENT:     Head: Normocephalic.  Eyes:     Extraocular Movements: Extraocular movements intact.     Conjunctiva/sclera: Conjunctivae normal.     Pupils: Pupils are equal, round, and reactive to light.  Cardiovascular:     Rate and Rhythm: Normal rate and regular rhythm.     Pulses: Normal pulses.     Heart sounds: Normal heart sounds.  Pulmonary:     Effort: Pulmonary effort is normal.     Breath sounds: Normal breath sounds.  Musculoskeletal:        General: Normal range of motion.     Cervical back:  Normal range of motion and neck supple.     Right lower leg: No edema.     Left lower leg: No edema.  Skin:    General: Skin is warm and dry.     Capillary Refill: Capillary refill takes less than 2 seconds.  Neurological:     General: No focal deficit present.     Mental Status: She is alert and oriented to person, place, and time.  Psychiatric:        Mood and Affect: Mood normal.  Behavior: Behavior normal.     A total of 30 minutes was spent with the patient, greater than 50% of which was in counseling/coordination of care regarding hypertension and cardiovascular risks associated with this condition, review of medications and need to start new one, lisinopril-hydrochlorothiazide, review of most recent office visit notes, review of most recent blood work results, diet and nutrition, how to treat chronic constipation with fiber supplements, prognosis and need for follow-up in 3 months.  ASSESSMENT & PLAN: Essential hypertension Elevated blood pressure.  Continue amlodipine 5 mg daily and start lisinopril-hydrochlorothiazide 20-12.5 mg daily.  Diet and nutrition discussed. Follow-up in 3 months.  Taytem was seen today for hypertension, stress and night mears.  Diagnoses and all orders for this visit:  Essential hypertension -     lisinopril-hydrochlorothiazide (ZESTORETIC) 20-12.5 MG tablet; Take 1 tablet by mouth daily.    Patient Instructions       If you have lab work done today you will be contacted with your lab results within the next 2 weeks.  If you have not heard from [place] then please contact us. The fastest way to get your results is to register for My Chart.   IF you received an x-ray today, you will receive an invoice from [facility]. Please contact [facility] at [phone] with questions or concerns regarding your invoice.   IF you received labwork today, you will receive an invoice from [facility]. Please contact [facility] at  [phone] with questions or concerns regarding your invoice.   Our billing staff will not be able to assist you with questions regarding bills from these companies.  You will be contacted with the lab results as soon as they are available. The fastest way to get your results is to activate your My Chart account. Instructions are located on the last page of this paperwork. If you have not heard from [place] regarding the results in 2 weeks, please contact this office.      Hypertension, Adult High blood pressure (hypertension) is when the force of blood pumping through the arteries is too strong. The arteries are the blood vessels that carry blood from the heart throughout the body. Hypertension forces the heart to work harder to pump blood and may cause arteries to become narrow or stiff. Untreated or uncontrolled hypertension can cause a heart attack, heart failure, a stroke, kidney disease, and other problems. A blood pressure reading consists of a higher number over a lower number. Ideally, your blood pressure should be below 120/80. The first ("top") number is called the systolic pressure. It is a measure of the pressure in your arteries as your heart beats. The second ("bottom") number is called the diastolic pressure. It is a measure of the pressure in your arteries as the heart relaxes. What are the causes? The exact cause of this condition is not known. There are some conditions that result in or are related to high blood pressure. What increases the risk? Some risk factors for high blood pressure are under your control. The following factors may make you more likely to develop this condition:  Smoking.  Having type 2 diabetes mellitus, high cholesterol, or both.  Not getting enough exercise or physical activity.  Being overweight.  Having too much fat, sugar, calories, or salt (sodium) in your diet.  Drinking too much alcohol. Some risk factors for high blood pressure may be  difficult or impossible to change. Some of these factors include:  Having chronic kidney disease.  Having a  family history of high blood pressure.  Age. Risk increases with age.  Race. You may be at higher risk if you are African American.  Gender. Men are at higher risk than women before age 69. After age 53, women are at higher risk than men.  Having obstructive sleep apnea.  Stress. What are the signs or symptoms? High blood pressure may not cause symptoms. Very high blood pressure (hypertensive crisis) may cause:  Headache.  Anxiety.  Shortness of breath.  Nosebleed.  Nausea and vomiting.  Vision changes.  Severe chest pain.  Seizures. How is this diagnosed? This condition is diagnosed by measuring your blood pressure while you are seated, with your arm resting on a flat surface, your legs uncrossed, and your feet flat on the floor. The cuff of the blood pressure monitor will be placed directly against the skin of your upper arm at the level of your heart. It should be measured at least twice using the same arm. Certain conditions can cause a difference in blood pressure between your right and left arms. Certain factors can cause blood pressure readings to be lower or higher than normal for a short period of time:  When your blood pressure is higher when you are in a health care provider's office than when you are at home, this is called white coat hypertension. Most people with this condition do not need medicines.  When your blood pressure is higher at home than when you are in a health care provider's office, this is called masked hypertension. Most people with this condition may need medicines to control blood pressure. If you have a high blood pressure reading during one visit or you have normal blood pressure with other risk factors, you may be asked to:  Return on a different day to have your blood pressure checked again.  Monitor your blood pressure at home for  1 week or longer. If you are diagnosed with hypertension, you may have other blood or imaging tests to help your health care provider understand your overall risk for other conditions. How is this treated? This condition is treated by making healthy lifestyle changes, such as eating healthy foods, exercising more, and reducing your alcohol intake. Your health care provider may prescribe medicine if lifestyle changes are not enough to get your blood pressure under control, and if:  Your systolic blood pressure is above 130.  Your diastolic blood pressure is above 80. Your personal target blood pressure may vary depending on your medical conditions, your age, and other factors. Follow these instructions at home: Eating and drinking   Eat a diet that is high in fiber and potassium, and low in sodium, added sugar, and fat. An example eating plan is called the DASH (Dietary Approaches to Stop Hypertension) diet. To eat this way: ? Eat plenty of fresh fruits and vegetables. Try to fill one half of your plate at each meal with fruits and vegetables. ? Eat whole grains, such as whole-wheat pasta, brown rice, or whole-grain bread. Fill about one fourth of your plate with whole grains. ? Eat or drink low-fat dairy products, such as skim milk or low-fat yogurt. ? Avoid fatty cuts of meat, processed or cured meats, and poultry with skin. Fill about one fourth of your plate with lean proteins, such as fish, chicken without skin, beans, eggs, or tofu. ? Avoid pre-made and processed foods. These tend to be higher in sodium, added sugar, and fat.  Reduce your daily sodium intake. Most people  with hypertension should eat less than 1,500 mg of sodium a day.  Do not drink alcohol if: ? Your health care provider tells you not to drink. ? You are pregnant, may be pregnant, or are planning to become pregnant.  If you drink alcohol: ? Limit how much you use to:  0-1 drink a day for women.  0-2 drinks a day  for men. ? Be aware of how much alcohol is in your drink. In the [place] one drink equals one 12 oz bottle of beer (355 mL), one 5 oz glass of wine (148 mL), or one 1 oz glass of hard liquor (44 mL). Lifestyle   Work with your health care provider to maintain a healthy body weight or to lose weight. Ask what an ideal weight is for you.  Get at least 30 minutes of exercise most days of the week. Activities may include walking, swimming, or biking.  Include exercise to strengthen your muscles (resistance exercise), such as Pilates or lifting weights, as part of your weekly exercise routine. Try to do these types of exercises for 30 minutes at least 3 days a week.  Do not use any products that contain nicotine or tobacco, such as cigarettes, e-cigarettes, and chewing tobacco. If you need help quitting, ask your health care provider.  Monitor your blood pressure at home as told by your health care provider.  Keep all follow-up visits as told by your health care provider. This is important. Medicines  Take over-the-counter and prescription medicines only as told by your health care provider. Follow directions carefully. Blood pressure medicines must be taken as prescribed.  Do not skip doses of blood pressure medicine. Doing this puts you at risk for problems and can make the medicine less effective.  Ask your health care provider about side effects or reactions to medicines that you should watch for. Contact a health care provider if you:  Think you are having a reaction to a medicine you are taking.  Have headaches that keep coming back (recurring).  Feel dizzy.  Have swelling in your ankles.  Have trouble with your vision. Get help right away if you:  Develop a severe headache or confusion.  Have unusual weakness or numbness.  Feel faint.  Have severe pain in your chest or abdomen.  Vomit repeatedly.  Have trouble breathing. Summary  Hypertension is when the force of  blood pumping through your arteries is too strong. If this condition is not controlled, it may put you at risk for serious complications.  Your personal target blood pressure may vary depending on your medical conditions, your age, and other factors. For most people, a normal blood pressure is less than 120/80.  Hypertension is treated with lifestyle changes, medicines, or a combination of both. Lifestyle changes include losing weight, eating a healthy, low-sodium diet, exercising more, and limiting alcohol. This information is not intended to replace advice given to you by your health care provider. Make sure you discuss any questions you have with your health care provider. Document Revised: 01/18/2018 Document Reviewed: 01/18/2018 Elsevier Patient Education  2020 Elsevier Inc.      Agustina Caroli, MD [facility]

## 2019-11-15 NOTE — Telephone Encounter (Signed)
I have called in the Amlodipine to the [facility] mail order and she should receive it in the next 5-7 business day. It has also been e-scribe from todays visit.  I [facility] in [facility] temp [facility] local [facility].  Thanks.   Pt is aware.

## 2019-11-15 NOTE — Telephone Encounter (Signed)
Pt is calling because she was under the understanding that she needs to be taking Lisinopril along with her amlodipine/ also [facility] needs to know that there is a refill ,,,it needs to done mail delivery  pt currently does not have a refill on amlodipine and is out.   Please advise

## 2019-11-15 NOTE — Assessment & Plan Note (Signed)
Elevated blood pressure.  Continue amlodipine 5 mg daily and start lisinopril-hydrochlorothiazide 20-12.5 mg daily.  Diet and nutrition discussed. Follow-up in 3 months.

## 2019-11-15 NOTE — Addendum Note (Signed)
Addended by: Kittie Plater, Davisha Linthicum HUA on: 11/15/2019 02:44 PM   Modules accepted: Orders

## 2019-11-15 NOTE — Patient Instructions (Addendum)
   If you have lab work done today you will be contacted with your lab results within the next 2 weeks.  If you have not heard from us then please contact us. The fastest way to get your results is to register for My Chart.   IF you received an x-ray today, you will receive an invoice from [facility]. Please contact [facility] at [phone] with questions or concerns regarding your invoice.   IF you received labwork today, you will receive an invoice from [facility]. Please contact [facility] at [phone] with questions or concerns regarding your invoice.   Our billing staff will not be able to assist you with questions regarding bills from these companies.  You will be contacted with the lab results as soon as they are available. The fastest way to get your results is to activate your My Chart account. Instructions are located on the last page of this paperwork. If you have not heard from us regarding the results in 2 weeks, please contact this office.      Hypertension, Adult High blood pressure (hypertension) is when the force of blood pumping through the arteries is too strong. The arteries are the blood vessels that carry blood from the heart throughout the body. Hypertension forces the heart to work harder to pump blood and may cause arteries to become narrow or stiff. Untreated or uncontrolled hypertension can cause a heart attack, heart failure, a stroke, kidney disease, and other problems. A blood pressure reading consists of a higher number over a lower number. Ideally, your blood pressure should be below 120/80. The first ("top") number is called the systolic pressure. It is a measure of the pressure in your arteries as your heart beats. The second ("bottom") number is called the diastolic pressure. It is a measure of the pressure in your arteries as the heart relaxes. What are the causes? The exact cause of this condition is not known. There are some conditions  that result in or are related to high blood pressure. What increases the risk? Some risk factors for high blood pressure are under your control. The following factors may make you more likely to develop this condition:  Smoking.  Having type 2 diabetes mellitus, high cholesterol, or both.  Not getting enough exercise or physical activity.  Being overweight.  Having too much fat, sugar, calories, or salt (sodium) in your diet.  Drinking too much alcohol. Some risk factors for high blood pressure may be difficult or impossible to change. Some of these factors include:  Having chronic kidney disease.  Having a family history of high blood pressure.  Age. Risk increases with age.  Race. You may be at higher risk if you are African American.  Gender. Men are at higher risk than women before age 45. After age 65, women are at higher risk than men.  Having obstructive sleep apnea.  Stress. What are the signs or symptoms? High blood pressure may not cause symptoms. Very high blood pressure (hypertensive crisis) may cause:  Headache.  Anxiety.  Shortness of breath.  Nosebleed.  Nausea and vomiting.  Vision changes.  Severe chest pain.  Seizures. How is this diagnosed? This condition is diagnosed by measuring your blood pressure while you are seated, with your arm resting on a flat surface, your legs uncrossed, and your feet flat on the floor. The cuff of the blood pressure monitor will be placed directly against the skin of your upper arm at the level of your   heart. It should be measured at least twice using the same arm. Certain conditions can cause a difference in blood pressure between your right and left arms. Certain factors can cause blood pressure readings to be lower or higher than normal for a short period of time:  When your blood pressure is higher when you are in a health care provider's office than when you are at home, this is called white coat hypertension.  Most people with this condition do not need medicines.  When your blood pressure is higher at home than when you are in a health care provider's office, this is called masked hypertension. Most people with this condition may need medicines to control blood pressure. If you have a high blood pressure reading during one visit or you have normal blood pressure with other risk factors, you may be asked to:  Return on a different day to have your blood pressure checked again.  Monitor your blood pressure at home for 1 week or longer. If you are diagnosed with hypertension, you may have other blood or imaging tests to help your health care provider understand your overall risk for other conditions. How is this treated? This condition is treated by making healthy lifestyle changes, such as eating healthy foods, exercising more, and reducing your alcohol intake. Your health care provider may prescribe medicine if lifestyle changes are not enough to get your blood pressure under control, and if:  Your systolic blood pressure is above 130.  Your diastolic blood pressure is above 80. Your personal target blood pressure may vary depending on your medical conditions, your age, and other factors. Follow these instructions at home: Eating and drinking   Eat a diet that is high in fiber and potassium, and low in sodium, added sugar, and fat. An example eating plan is called the DASH (Dietary Approaches to Stop Hypertension) diet. To eat this way: ? Eat plenty of fresh fruits and vegetables. Try to fill one half of your plate at each meal with fruits and vegetables. ? Eat whole grains, such as whole-wheat pasta, brown rice, or whole-grain bread. Fill about one fourth of your plate with whole grains. ? Eat or drink low-fat dairy products, such as skim milk or low-fat yogurt. ? Avoid fatty cuts of meat, processed or cured meats, and poultry with skin. Fill about one fourth of your plate with lean proteins, such  as fish, chicken without skin, beans, eggs, or tofu. ? Avoid pre-made and processed foods. These tend to be higher in sodium, added sugar, and fat.  Reduce your daily sodium intake. Most people with hypertension should eat less than 1,500 mg of sodium a day.  Do not drink alcohol if: ? Your health care provider tells you not to drink. ? You are pregnant, may be pregnant, or are planning to become pregnant.  If you drink alcohol: ? Limit how much you use to:  0-1 drink a day for women.  0-2 drinks a day for men. ? Be aware of how much alcohol is in your drink. In the [place] one drink equals one 12 oz bottle of beer (355 mL), one 5 oz glass of wine (148 mL), or one 1 oz glass of hard liquor (44 mL). Lifestyle   Work with your health care provider to maintain a healthy body weight or to lose weight. Ask what an ideal weight is for you.  Get at least 30 minutes of exercise most days of the week. Activities may include walking, swimming,   or biking.  Include exercise to strengthen your muscles (resistance exercise), such as Pilates or lifting weights, as part of your weekly exercise routine. Try to do these types of exercises for 30 minutes at least 3 days a week.  Do not use any products that contain nicotine or tobacco, such as cigarettes, e-cigarettes, and chewing tobacco. If you need help quitting, ask your health care provider.  Monitor your blood pressure at home as told by your health care provider.  Keep all follow-up visits as told by your health care provider. This is important. Medicines  Take over-the-counter and prescription medicines only as told by your health care provider. Follow directions carefully. Blood pressure medicines must be taken as prescribed.  Do not skip doses of blood pressure medicine. Doing this puts you at risk for problems and can make the medicine less effective.  Ask your health care provider about side effects or reactions to medicines that you  should watch for. Contact a health care provider if you:  Think you are having a reaction to a medicine you are taking.  Have headaches that keep coming back (recurring).  Feel dizzy.  Have swelling in your ankles.  Have trouble with your vision. Get help right away if you:  Develop a severe headache or confusion.  Have unusual weakness or numbness.  Feel faint.  Have severe pain in your chest or abdomen.  Vomit repeatedly.  Have trouble breathing. Summary  Hypertension is when the force of blood pumping through your arteries is too strong. If this condition is not controlled, it may put you at risk for serious complications.  Your personal target blood pressure may vary depending on your medical conditions, your age, and other factors. For most people, a normal blood pressure is less than 120/80.  Hypertension is treated with lifestyle changes, medicines, or a combination of both. Lifestyle changes include losing weight, eating a healthy, low-sodium diet, exercising more, and limiting alcohol. This information is not intended to replace advice given to you by your health care provider. Make sure you discuss any questions you have with your health care provider. Document Revised: 01/18/2018 Document Reviewed: 01/18/2018 Elsevier Patient Education  2020 [employer]  

## 2020-02-12 ENCOUNTER — Other Ambulatory Visit: Payer: Self-pay

## 2020-02-12 ENCOUNTER — Encounter: Payer: Self-pay | Admitting: Emergency Medicine

## 2020-02-12 ENCOUNTER — Ambulatory Visit: Payer: No Typology Code available for payment source | Admitting: Emergency Medicine

## 2020-02-12 VITALS — BP 146/98 | HR 98 | Temp 97.5°F | Ht 65.0 in | Wt 183.0 lb

## 2020-02-12 DIAGNOSIS — Z6830 Body mass index (BMI) 30.0-30.9, adult: Secondary | ICD-10-CM | POA: Diagnosis not present

## 2020-02-12 DIAGNOSIS — I10 Essential (primary) hypertension: Secondary | ICD-10-CM | POA: Diagnosis not present

## 2020-02-12 DIAGNOSIS — Z0000 Encounter for general adult medical examination without abnormal findings: Secondary | ICD-10-CM

## 2020-02-12 NOTE — Patient Instructions (Addendum)
   If you have lab work done today you will be contacted with your lab results within the next 2 weeks.  If you have not heard from us then please contact us. The fastest way to get your results is to register for My Chart.   IF you received an x-ray today, you will receive an invoice from [facility]. Please contact  Radiology at [phone] with questions or concerns regarding your invoice.   IF you received labwork today, you will receive an invoice from [facility]. Please contact [facility] at [phone] with questions or concerns regarding your invoice.   Our billing staff will not be able to assist you with questions regarding bills from these companies.  You will be contacted with the lab results as soon as they are available. The fastest way to get your results is to activate your My Chart account. Instructions are located on the last page of this paperwork. If you have not heard from us regarding the results in 2 weeks, please contact this office.      Hypertension, Adult High blood pressure (hypertension) is when the force of blood pumping through the arteries is too strong. The arteries are the blood vessels that carry blood from the heart throughout the body. Hypertension forces the heart to work harder to pump blood and may cause arteries to become narrow or stiff. Untreated or uncontrolled hypertension can cause a heart attack, heart failure, a stroke, kidney disease, and other problems. A blood pressure reading consists of a higher number over a lower number. Ideally, your blood pressure should be below 120/80. The first ("top") number is called the systolic pressure. It is a measure of the pressure in your arteries as your heart beats. The second ("bottom") number is called the diastolic pressure. It is a measure of the pressure in your arteries as the heart relaxes. What are the causes? The exact cause of this condition is not known. There are some conditions  that result in or are related to high blood pressure. What increases the risk? Some risk factors for high blood pressure are under your control. The following factors may make you more likely to develop this condition:  Smoking.  Having type 2 diabetes mellitus, high cholesterol, or both.  Not getting enough exercise or physical activity.  Being overweight.  Having too much fat, sugar, calories, or salt (sodium) in your diet.  Drinking too much alcohol. Some risk factors for high blood pressure may be difficult or impossible to change. Some of these factors include:  Having chronic kidney disease.  Having a family history of high blood pressure.  Age. Risk increases with age.  Race. You may be at higher risk if you are African American.  Gender. Men are at higher risk than women before age 45. After age 65, women are at higher risk than men.  Having obstructive sleep apnea.  Stress. What are the signs or symptoms? High blood pressure may not cause symptoms. Very high blood pressure (hypertensive crisis) may cause:  Headache.  Anxiety.  Shortness of breath.  Nosebleed.  Nausea and vomiting.  Vision changes.  Severe chest pain.  Seizures. How is this diagnosed? This condition is diagnosed by measuring your blood pressure while you are seated, with your arm resting on a flat surface, your legs uncrossed, and your feet flat on the floor. The cuff of the blood pressure monitor will be placed directly against the skin of your upper arm at the level of your   heart. It should be measured at least twice using the same arm. Certain conditions can cause a difference in blood pressure between your right and left arms. Certain factors can cause blood pressure readings to be lower or higher than normal for a short period of time:  When your blood pressure is higher when you are in a health care provider's office than when you are at home, this is called white coat hypertension.  Most people with this condition do not need medicines.  When your blood pressure is higher at home than when you are in a health care provider's office, this is called masked hypertension. Most people with this condition may need medicines to control blood pressure. If you have a high blood pressure reading during one visit or you have normal blood pressure with other risk factors, you may be asked to:  Return on a different day to have your blood pressure checked again.  Monitor your blood pressure at home for 1 week or longer. If you are diagnosed with hypertension, you may have other blood or imaging tests to help your health care provider understand your overall risk for other conditions. How is this treated? This condition is treated by making healthy lifestyle changes, such as eating healthy foods, exercising more, and reducing your alcohol intake. Your health care provider may prescribe medicine if lifestyle changes are not enough to get your blood pressure under control, and if:  Your systolic blood pressure is above 130.  Your diastolic blood pressure is above 80. Your personal target blood pressure may vary depending on your medical conditions, your age, and other factors. Follow these instructions at home: Eating and drinking   Eat a diet that is high in fiber and potassium, and low in sodium, added sugar, and fat. An example eating plan is called the DASH (Dietary Approaches to Stop Hypertension) diet. To eat this way: ? Eat plenty of fresh fruits and vegetables. Try to fill one half of your plate at each meal with fruits and vegetables. ? Eat whole grains, such as whole-wheat pasta, brown rice, or whole-grain bread. Fill about one fourth of your plate with whole grains. ? Eat or drink low-fat dairy products, such as skim milk or low-fat yogurt. ? Avoid fatty cuts of meat, processed or cured meats, and poultry with skin. Fill about one fourth of your plate with lean proteins, such  as fish, chicken without skin, beans, eggs, or tofu. ? Avoid pre-made and processed foods. These tend to be higher in sodium, added sugar, and fat.  Reduce your daily sodium intake. Most people with hypertension should eat less than 1,500 mg of sodium a day.  Do not drink alcohol if: ? Your health care provider tells you not to drink. ? You are pregnant, may be pregnant, or are planning to become pregnant.  If you drink alcohol: ? Limit how much you use to:  0-1 drink a day for women.  0-2 drinks a day for men. ? Be aware of how much alcohol is in your drink. In the [place] one drink equals one 12 oz bottle of beer (355 mL), one 5 oz glass of wine (148 mL), or one 1 oz glass of hard liquor (44 mL). Lifestyle   Work with your health care provider to maintain a healthy body weight or to lose weight. Ask what an ideal weight is for you.  Get at least 30 minutes of exercise most days of the week. Activities may include walking, swimming,   or biking.  Include exercise to strengthen your muscles (resistance exercise), such as Pilates or lifting weights, as part of your weekly exercise routine. Try to do these types of exercises for 30 minutes at least 3 days a week.  Do not use any products that contain nicotine or tobacco, such as cigarettes, e-cigarettes, and chewing tobacco. If you need help quitting, ask your health care provider.  Monitor your blood pressure at home as told by your health care provider.  Keep all follow-up visits as told by your health care provider. This is important. Medicines  Take over-the-counter and prescription medicines only as told by your health care provider. Follow directions carefully. Blood pressure medicines must be taken as prescribed.  Do not skip doses of blood pressure medicine. Doing this puts you at risk for problems and can make the medicine less effective.  Ask your health care provider about side effects or reactions to medicines that you  should watch for. Contact a health care provider if you:  Think you are having a reaction to a medicine you are taking.  Have headaches that keep coming back (recurring).  Feel dizzy.  Have swelling in your ankles.  Have trouble with your vision. Get help right away if you:  Develop a severe headache or confusion.  Have unusual weakness or numbness.  Feel faint.  Have severe pain in your chest or abdomen.  Vomit repeatedly.  Have trouble breathing. Summary  Hypertension is when the force of blood pumping through your arteries is too strong. If this condition is not controlled, it may put you at risk for serious complications.  Your personal target blood pressure may vary depending on your medical conditions, your age, and other factors. For most people, a normal blood pressure is less than 120/80.  Hypertension is treated with lifestyle changes, medicines, or a combination of both. Lifestyle changes include losing weight, eating a healthy, low-sodium diet, exercising more, and limiting alcohol. This information is not intended to replace advice given to you by your health care provider. Make sure you discuss any questions you have with your health care provider. Document Revised: 01/18/2018 Document Reviewed: 01/18/2018 Elsevier Patient Education  2020 [employer]  

## 2020-02-12 NOTE — Assessment & Plan Note (Signed)
Elevated blood pressure.  Noncompliant with Zestoretic. Continue amlodipine 5 mg daily and make sure to take Zestoretic on a daily basis as well.  Continue excellent diet and exercise regimen. Follow-up in 6 months.

## 2020-02-12 NOTE — Progress Notes (Signed)
Janet Burnett 44 y.o.   Chief Complaint  Patient presents with   Hypertension   referral to gyn   weight and nutrition    HISTORY OF PRESENT ILLNESS: This is a 44 y.o. female with history of hypertension here for follow-up.  On amlodipine 5 mg daily.  Supposed to be taking lisinopril-hydrochlorothiazide but sometimes forgets. No complaints or medical concerns today. Doing well.  Exercising more and eating better.  Has been able to lose weight.  HPI   Prior to Admission medications   Medication Sig Start Date End Date Taking? Authorizing Provider  amLODipine (NORVASC) 5 MG tablet Take 1 tablet (5 mg total) by mouth daily for 5 days. 11/15/19 02/12/20 Yes Jaryah Aracena, Ines Bloomer, MD  BLACK CURRANT SEED OIL PO Take by mouth daily.    Yes [provider]  lisinopril-hydrochlorothiazide (ZESTORETIC) 20-12.5 MG tablet Take 1 tablet by mouth daily. 11/15/19  Yes Domanick Cuccia, Ines Bloomer, MD  Multiple Vitamin (MULTIVITAMIN WITH MINERALS) TABS tablet Take 1 tablet by mouth daily.   Yes [provider]  Probiotic Product (PROBIOTIC DAILY PO) Take by mouth.   Yes [provider]  vitamin B-12 (CYANOCOBALAMIN) 1000 MCG tablet Take 1,000 mcg by mouth daily.   Yes [provider]  vitamin C (ASCORBIC ACID) 250 MG tablet Take 250 mg by mouth daily.   Yes [provider]  Biotin w/ Vitamins C & E (HAIR SKIN & NAILS GUMMIES PO) Take by mouth. Patient not taking: Reported on 02/12/2020    [provider]    No Known Allergies  Patient Active Problem List   Diagnosis Date Noted   Low back pain 06/28/2017   Strain of lumbar region 06/28/2017   Fibroid 01/07/2015   Essential hypertension 09/17/2014   Anemia due to chronic blood loss 08/06/2014   History of transfusion 08/06/2014   Menorrhagia with regular cycle 08/06/2014   Dysmenorrhea 08/06/2014   Fibroids 07/25/2014    Past Medical History:  Diagnosis Date   Blood transfusion  without reported diagnosis 05/13/14   In [place], 2 units transfused    Fibroid    Hypertension    SVD (spontaneous vaginal delivery)    x 2   Trichimoniasis 02/2017    Past Surgical History:  Procedure Laterality Date   ABDOMINAL HYSTERECTOMY  2016   BILATERAL SALPINGECTOMY Bilateral 01/07/2015   Procedure: BILATERAL SALPINGECTOMY;  Surgeon: Donnamae Jude, MD;  Location: [facility];  Service: Gynecology;  Laterality: Bilateral;   EYE SURGERY     as a child lazy eye- right   VAGINAL HYSTERECTOMY N/A 01/07/2015   Procedure: HYSTERECTOMY VAGINAL;  Surgeon: Donnamae Jude, MD;  Location: [facility];  Service: Gynecology;  Laterality: N/A;   WISDOM TOOTH EXTRACTION      Social History   Socioeconomic History   Marital status: Single    Spouse name: Not on file   Number of children: Not on file   Years of education: Not on file   Highest education level: Not on file  Occupational History   Not on file  Tobacco Use   Smoking status: Never Smoker   Smokeless tobacco: Never Used  Vaping Use   Vaping Use: Never used  Substance and Sexual Activity   Alcohol use: Yes    Alcohol/week: 0.0 standard drinks    Comment: WINE OCC   Drug use: No   Sexual activity: Not Currently    Birth control/protection: None  Other Topics Concern   Not on file  Social  History Narrative   Not on file   Social Determinants of Health   Financial Resource Strain:    Difficulty of Paying Living Expenses: Not on file  Food Insecurity:    Worried About Wisdom in the Last Year: Not on file   Ran Out of Food in the Last Year: Not on file  Transportation Needs:    Lack of Transportation (Medical): Not on file   Lack of Transportation (Non-Medical): Not on file  Physical Activity:    Days of Exercise per Week: Not on file   Minutes of Exercise per Session: Not on file  Stress:    Feeling of Stress : Not on file  Social Connections:    Frequency of Communication  with Friends and Family: Not on file   Frequency of Social Gatherings with Friends and Family: Not on file   Attends Religious Services: Not on file   Active Member of Clubs or Organizations: Not on file   Attends Archivist Meetings: Not on file   Marital Status: Not on file  Intimate Partner Violence:    Fear of Current or Ex-Partner: Not on file   Emotionally Abused: Not on file   Physically Abused: Not on file   Sexually Abused: Not on file    Family History  Problem Relation Age of Onset   Stroke Mother    Asthma Father    Diabetes Maternal Grandmother    Hypertension Maternal Grandmother    Breast cancer Paternal Grandmother      Review of Systems  Constitutional: Negative.  Negative for chills and fever.  HENT: Negative.  Negative for congestion and sore throat.   Respiratory: Negative.  Negative for cough and shortness of breath.   Cardiovascular: Negative.  Negative for chest pain and palpitations.  Gastrointestinal: Negative.  Negative for abdominal pain, diarrhea, nausea and vomiting.  Genitourinary: Negative.  Negative for dysuria and hematuria.  Musculoskeletal: Negative.  Negative for back pain, myalgias and neck pain.  Skin: Negative.  Negative for rash.  Neurological: Negative.  Negative for dizziness and headaches.  All other systems reviewed and are negative.   Today's Vitals   02/12/20 1326 02/12/20 1338  BP: (!) 146/98 (!) 146/98  Pulse: 98   Temp: (!) 97.5 F (36.4 C)   SpO2: 98%   Weight: 183 lb (83 kg)   Height: 5\' 5"  (1.651 m)    Body mass index is 30.45 kg/m. Wt Readings from Last 3 Encounters:  02/12/20 183 lb (83 kg)  11/15/19 197 lb 12.8 oz (89.7 kg)  03/12/19 183 lb 6.4 oz (83.2 kg)    Physical Exam Vitals reviewed.  Constitutional:      Appearance: Normal appearance.  Eyes:     Extraocular Movements: Extraocular movements intact.     Pupils: Pupils are equal, round, and reactive to light.    Cardiovascular:     Rate and Rhythm: Normal rate and regular rhythm.     Pulses: Normal pulses.     Heart sounds: Normal heart sounds.  Pulmonary:     Effort: Pulmonary effort is normal.     Breath sounds: Normal breath sounds.  Abdominal:     General: There is no distension.     Palpations: Abdomen is soft.     Tenderness: There is no abdominal tenderness.  Musculoskeletal:        General: Normal range of motion.     Cervical back: Normal range of motion.  Skin:  General: Skin is warm and dry.     Capillary Refill: Capillary refill takes less than 2 seconds.  Neurological:     General: No focal deficit present.     Mental Status: She is alert and oriented to person, place, and time.  Psychiatric:        Mood and Affect: Mood normal.        Behavior: Behavior normal.    A total of 30 minutes was spent with the patient, greater than 50% of which was in counseling/coordination of care regarding hypertension and cardiovascular risk associated with this condition, review of all medications, review of most recent office visit notes, need for blood work, diet and nutrition, prognosis and need for follow-up in 6 months.   ASSESSMENT & PLAN: Essential hypertension Elevated blood pressure.  Noncompliant with Zestoretic. Continue amlodipine 5 mg daily and make sure to take Zestoretic on a daily basis as well.  Continue excellent diet and exercise regimen. Follow-up in 6 months.  Elnita was seen today for hypertension, referral to gyn and weight and nutrition.  Diagnoses and all orders for this visit:  Essential hypertension -     Comprehensive metabolic panel -     Lipid panel -     CBC with Differential/Platelet -     Hemoglobin A1c  Body mass index (BMI) of 30.0-30.9 in adult  Well woman exam (no gynecological exam) -     Ambulatory referral to Gynecology    Patient Instructions       If you have lab work done today you will be contacted with your lab results  within the next 2 weeks.  If you have not heard from [place] then please contact us. The fastest way to get your results is to register for My Chart.   IF you received an x-ray today, you will receive an invoice from [facility]. Please contact [facility] at [phone] with questions or concerns regarding your invoice.   IF you received labwork today, you will receive an invoice from [facility]. Please contact [facility] at [phone] with questions or concerns regarding your invoice.   Our billing staff will not be able to assist you with questions regarding bills from these companies.  You will be contacted with the lab results as soon as they are available. The fastest way to get your results is to activate your My Chart account. Instructions are located on the last page of this paperwork. If you have not heard from [place] regarding the results in 2 weeks, please contact this office.      Hypertension, Adult High blood pressure (hypertension) is when the force of blood pumping through the arteries is too strong. The arteries are the blood vessels that carry blood from the heart throughout the body. Hypertension forces the heart to work harder to pump blood and may cause arteries to become narrow or stiff. Untreated or uncontrolled hypertension can cause a heart attack, heart failure, a stroke, kidney disease, and other problems. A blood pressure reading consists of a higher number over a lower number. Ideally, your blood pressure should be below 120/80. The first ("top") number is called the systolic pressure. It is a measure of the pressure in your arteries as your heart beats. The second ("bottom") number is called the diastolic pressure. It is a measure of the pressure in your arteries as the heart relaxes. What are the causes? The exact cause of this condition is not known. There are some conditions that result in  or are related to high blood pressure. What increases the  risk? Some risk factors for high blood pressure are under your control. The following factors may make you more likely to develop this condition:  Smoking.  Having type 2 diabetes mellitus, high cholesterol, or both.  Not getting enough exercise or physical activity.  Being overweight.  Having too much fat, sugar, calories, or salt (sodium) in your diet.  Drinking too much alcohol. Some risk factors for high blood pressure may be difficult or impossible to change. Some of these factors include:  Having chronic kidney disease.  Having a family history of high blood pressure.  Age. Risk increases with age.  Race. You may be at higher risk if you are African American.  Gender. Men are at higher risk than women before age 61. After age 66, women are at higher risk than men.  Having obstructive sleep apnea.  Stress. What are the signs or symptoms? High blood pressure may not cause symptoms. Very high blood pressure (hypertensive crisis) may cause:  Headache.  Anxiety.  Shortness of breath.  Nosebleed.  Nausea and vomiting.  Vision changes.  Severe chest pain.  Seizures. How is this diagnosed? This condition is diagnosed by measuring your blood pressure while you are seated, with your arm resting on a flat surface, your legs uncrossed, and your feet flat on the floor. The cuff of the blood pressure monitor will be placed directly against the skin of your upper arm at the level of your heart. It should be measured at least twice using the same arm. Certain conditions can cause a difference in blood pressure between your right and left arms. Certain factors can cause blood pressure readings to be lower or higher than normal for a short period of time:  When your blood pressure is higher when you are in a health care provider's office than when you are at home, this is called white coat hypertension. Most people with this condition do not need medicines.  When your blood  pressure is higher at home than when you are in a health care provider's office, this is called masked hypertension. Most people with this condition may need medicines to control blood pressure. If you have a high blood pressure reading during one visit or you have normal blood pressure with other risk factors, you may be asked to:  Return on a different day to have your blood pressure checked again.  Monitor your blood pressure at home for 1 week or longer. If you are diagnosed with hypertension, you may have other blood or imaging tests to help your health care provider understand your overall risk for other conditions. How is this treated? This condition is treated by making healthy lifestyle changes, such as eating healthy foods, exercising more, and reducing your alcohol intake. Your health care provider may prescribe medicine if lifestyle changes are not enough to get your blood pressure under control, and if:  Your systolic blood pressure is above 130.  Your diastolic blood pressure is above 80. Your personal target blood pressure may vary depending on your medical conditions, your age, and other factors. Follow these instructions at home: Eating and drinking   Eat a diet that is high in fiber and potassium, and low in sodium, added sugar, and fat. An example eating plan is called the DASH (Dietary Approaches to Stop Hypertension) diet. To eat this way: ? Eat plenty of fresh fruits and vegetables. Try to fill one half of your plate  at each meal with fruits and vegetables. ? Eat whole grains, such as whole-wheat pasta, brown rice, or whole-grain bread. Fill about one fourth of your plate with whole grains. ? Eat or drink low-fat dairy products, such as skim milk or low-fat yogurt. ? Avoid fatty cuts of meat, processed or cured meats, and poultry with skin. Fill about one fourth of your plate with lean proteins, such as fish, chicken without skin, beans, eggs, or tofu. ? Avoid pre-made  and processed foods. These tend to be higher in sodium, added sugar, and fat.  Reduce your daily sodium intake. Most people with hypertension should eat less than 1,500 mg of sodium a day.  Do not drink alcohol if: ? Your health care provider tells you not to drink. ? You are pregnant, may be pregnant, or are planning to become pregnant.  If you drink alcohol: ? Limit how much you use to:  0-1 drink a day for women.  0-2 drinks a day for men. ? Be aware of how much alcohol is in your drink. In the [place] one drink equals one 12 oz bottle of beer (355 mL), one 5 oz glass of wine (148 mL), or one 1 oz glass of hard liquor (44 mL). Lifestyle   Work with your health care provider to maintain a healthy body weight or to lose weight. Ask what an ideal weight is for you.  Get at least 30 minutes of exercise most days of the week. Activities may include walking, swimming, or biking.  Include exercise to strengthen your muscles (resistance exercise), such as Pilates or lifting weights, as part of your weekly exercise routine. Try to do these types of exercises for 30 minutes at least 3 days a week.  Do not use any products that contain nicotine or tobacco, such as cigarettes, e-cigarettes, and chewing tobacco. If you need help quitting, ask your health care provider.  Monitor your blood pressure at home as told by your health care provider.  Keep all follow-up visits as told by your health care provider. This is important. Medicines  Take over-the-counter and prescription medicines only as told by your health care provider. Follow directions carefully. Blood pressure medicines must be taken as prescribed.  Do not skip doses of blood pressure medicine. Doing this puts you at risk for problems and can make the medicine less effective.  Ask your health care provider about side effects or reactions to medicines that you should watch for. Contact a health care provider if you:  Think you are  having a reaction to a medicine you are taking.  Have headaches that keep coming back (recurring).  Feel dizzy.  Have swelling in your ankles.  Have trouble with your vision. Get help right away if you:  Develop a severe headache or confusion.  Have unusual weakness or numbness.  Feel faint.  Have severe pain in your chest or abdomen.  Vomit repeatedly.  Have trouble breathing. Summary  Hypertension is when the force of blood pumping through your arteries is too strong. If this condition is not controlled, it may put you at risk for serious complications.  Your personal target blood pressure may vary depending on your medical conditions, your age, and other factors. For most people, a normal blood pressure is less than 120/80.  Hypertension is treated with lifestyle changes, medicines, or a combination of both. Lifestyle changes include losing weight, eating a healthy, low-sodium diet, exercising more, and limiting alcohol. This information is not intended to  replace advice given to you by your health care provider. Make sure you discuss any questions you have with your health care provider. Document Revised: 01/18/2018 Document Reviewed: 01/18/2018 Elsevier Patient Education  2020 Elsevier Inc.      Agustina Caroli, MD [facility]

## 2020-02-13 LAB — COMPREHENSIVE METABOLIC PANEL
ALT: 7 IU/L (ref 0–32)
AST: 11 IU/L (ref 0–40)
Albumin/Globulin Ratio: 1.7 (ref 1.2–2.2)
Albumin: 4.7 g/dL (ref 3.8–4.8)
Alkaline Phosphatase: 88 IU/L (ref 44–121)
BUN/Creatinine Ratio: 12 (ref 9–23)
BUN: 10 mg/dL (ref 6–24)
Bilirubin Total: 0.3 mg/dL (ref 0.0–1.2)
CO2: 28 mmol/L (ref 20–29)
Calcium: 9.8 mg/dL (ref 8.7–10.2)
Chloride: 99 mmol/L (ref 96–106)
Creatinine, Ser: 0.83 mg/dL (ref 0.57–1.00)
GFR calc Af Amer: 98 mL/min/{1.73_m2} (ref >59)
GFR calc non Af Amer: 85 mL/min/{1.73_m2} (ref >59)
Globulin, Total: 2.8 g/dL (ref 1.5–4.5)
Glucose: 90 mg/dL (ref 65–99)
Potassium: 3.5 mmol/L (ref 3.5–5.2)
Sodium: 140 mmol/L (ref 134–144)
Total Protein: 7.5 g/dL (ref 6.0–8.5)

## 2020-02-13 LAB — CBC WITH DIFFERENTIAL/PLATELET
Basophils Absolute: 0.1 10*3/uL (ref 0.0–0.2)
Basos: 2 %
EOS (ABSOLUTE): 0.2 10*3/uL (ref 0.0–0.4)
Eos: 4 %
Hematocrit: 44.4 % (ref 34.0–46.6)
Hemoglobin: 14.8 g/dL (ref 11.1–15.9)
Immature Grans (Abs): 0 10*3/uL (ref 0.0–0.1)
Immature Granulocytes: 0 %
Lymphocytes Absolute: 2.2 10*3/uL (ref 0.7–3.1)
Lymphs: 43 %
MCH: 28.8 pg (ref 26.6–33.0)
MCHC: 33.3 g/dL (ref 31.5–35.7)
MCV: 86 fL (ref 79–97)
Monocytes Absolute: 0.4 10*3/uL (ref 0.1–0.9)
Monocytes: 8 %
Neutrophils Absolute: 2.2 10*3/uL (ref 1.4–7.0)
Neutrophils: 43 %
Platelets: 297 10*3/uL (ref 150–450)
RBC: 5.14 x10E6/uL (ref 3.77–5.28)
RDW: 13.3 % (ref 11.7–15.4)
WBC: 5.1 10*3/uL (ref 3.4–10.8)

## 2020-02-13 LAB — LIPID PANEL
Chol/HDL Ratio: 3.2 ratio (ref 0.0–4.4)
Cholesterol, Total: 154 mg/dL (ref 100–199)
HDL: 48 mg/dL (ref >39)
LDL Chol Calc (NIH): 91 mg/dL (ref 0–99)
Triglycerides: 79 mg/dL (ref 0–149)
VLDL Cholesterol Cal: 15 mg/dL (ref 5–40)

## 2020-02-13 LAB — HEMOGLOBIN A1C
Est. average glucose Bld gHb Est-mCnc: 120 mg/dL
Hgb A1c MFr Bld: 5.8 % — ABNORMAL HIGH (ref 4.8–5.6)

## 2020-02-14 ENCOUNTER — Ambulatory Visit: Payer: No Typology Code available for payment source | Admitting: Emergency Medicine

## 2020-04-14 ENCOUNTER — Encounter: Payer: BC Managed Care – PPO | Admitting: Obstetrics & Gynecology

## 2020-08-11 ENCOUNTER — Ambulatory Visit: Payer: No Typology Code available for payment source | Admitting: Emergency Medicine

## 2020-11-18 ENCOUNTER — Ambulatory Visit
Admission: EM | Admit: 2020-11-18 | Discharge: 2020-11-18 | Disposition: A | Discharge status: Home / Self Care | Payer: No Typology Code available for payment source | Attending: Physician Assistant | Admitting: Physician Assistant

## 2020-11-18 ENCOUNTER — Other Ambulatory Visit: Payer: Self-pay

## 2020-11-18 DIAGNOSIS — M62838 Other muscle spasm: Secondary | ICD-10-CM | POA: Diagnosis not present

## 2020-11-18 DIAGNOSIS — F411 Generalized anxiety disorder: Secondary | ICD-10-CM | POA: Diagnosis not present

## 2020-11-18 DIAGNOSIS — I10 Essential (primary) hypertension: Secondary | ICD-10-CM

## 2020-11-18 MED ORDER — CYCLOBENZAPRINE HCL 5 MG PO TABS: 5.0000 mg | ORAL_TABLET | Freq: Three times a day (TID) | ORAL | 0 refills | Status: AC | PRN

## 2020-11-18 NOTE — ED Provider Notes (Signed)
[name]    CSN: [id number] Arrival date & time: 11/18/20  1409      History   Chief Complaint Chief Complaint  Patient presents with   Back Pain    HPI Janet Burnett is a 45 y.o. female.   Pt complains of upper back pain and shoulder pain that started over the last few days.  She denies injury or trauma.  Denies radiation of pain, numbness, tingling.  She is taking nothing for the sx.  She works from home, reports she has been working overtime for the last few weeks.  She reports elevated blood pressure reading when she presented for a teeth cleaning.  She had been out of medication for about one week.  She was seen at [facility] and prescribed a 90 supply of medications.  She has been taking it now for 5 days.  She has a blood pressure cuff at home, has not been checking her BP.  Denies shortness of breath, palpitation, swelling of lower extremities  Reports she did have an episode of chest heaviness.  She also complains of increased anxiety recently.  She reports h/o panic attacks.  She is taking nothing for anxiety at this time.  She sees her PCP on 11/26/20.     Past Medical History:  Diagnosis Date   Blood transfusion without reported diagnosis 05/13/14   In [place], 2 units transfused    Fibroid    Hypertension    SVD (spontaneous vaginal delivery)    x 2   Trichimoniasis 02/2017    Patient Active Problem List   Diagnosis Date Noted   Low back pain 06/28/2017   Strain of lumbar region 06/28/2017   Fibroid 01/07/2015   Essential hypertension 09/17/2014   Anemia due to chronic blood loss 08/06/2014   History of transfusion 08/06/2014   Menorrhagia with regular cycle 08/06/2014   Dysmenorrhea 08/06/2014   Fibroids 07/25/2014    Past Surgical History:  Procedure Laterality Date   ABDOMINAL HYSTERECTOMY  2016   BILATERAL SALPINGECTOMY Bilateral 01/07/2015   Procedure: BILATERAL SALPINGECTOMY;  Surgeon: Donnamae Jude, MD;  Location: [facility];  Service:  Gynecology;  Laterality: Bilateral;   EYE SURGERY     as a child lazy eye- right   VAGINAL HYSTERECTOMY N/A 01/07/2015   Procedure: HYSTERECTOMY VAGINAL;  Surgeon: Donnamae Jude, MD;  Location: [facility];  Service: Gynecology;  Laterality: N/A;   WISDOM TOOTH EXTRACTION      OB History     Gravida  4   Para  2   Term      Preterm      AB  2   Living  2      SAB      IAB      Ectopic      Multiple      Live Births               Home Medications    Prior to Admission medications   Medication Sig Start Date End Date Taking? Authorizing Provider  amLODipine (NORVASC) 5 MG tablet Take 1 tablet (5 mg total) by mouth daily for 5 days. 11/15/19 02/12/20  Horald Pollen, MD  Biotin w/ Vitamins C & E (HAIR SKIN & NAILS GUMMIES PO) Take by mouth. Patient not taking: Reported on 02/12/2020    [provider]  BLACK CURRANT SEED OIL PO Take by mouth daily.     [provider]  lisinopril-hydrochlorothiazide (ZESTORETIC) 20-12.5 MG tablet  Take 1 tablet by mouth daily. 11/15/19   Horald Pollen, MD  Multiple Vitamin (MULTIVITAMIN WITH MINERALS) TABS tablet Take 1 tablet by mouth daily.    [provider]  Probiotic Product (PROBIOTIC DAILY PO) Take by mouth.    [provider]  vitamin B-12 (CYANOCOBALAMIN) 1000 MCG tablet Take 1,000 mcg by mouth daily.    [provider]  vitamin C (ASCORBIC ACID) 250 MG tablet Take 250 mg by mouth daily.    [provider]    Family History Family History  Problem Relation Age of Onset   Stroke Mother    Asthma Father    Diabetes Maternal Grandmother    Hypertension Maternal Grandmother    Breast cancer Paternal Grandmother     Social History Social History   Tobacco Use   Smoking status: Never   Smokeless tobacco: Never  Vaping Use   Vaping Use: Never used  Substance Use Topics   Alcohol use: Yes    Alcohol/week: 0.0 standard drinks    Comment: WINE OCC   Drug  use: No     Allergies   Patient has no known allergies.   Review of Systems Review of Systems  Constitutional:  Negative for chills and fever.  HENT:  Negative for ear pain and sore throat.   Eyes:  Negative for pain and visual disturbance.  Respiratory:  Positive for chest tightness. Negative for cough and shortness of breath.   Cardiovascular:  Negative for chest pain and palpitations.  Gastrointestinal:  Negative for abdominal pain and vomiting.  Genitourinary:  Negative for dysuria and hematuria.  Musculoskeletal:  Positive for back pain, neck pain and neck stiffness. Negative for arthralgias.  Skin:  Negative for color change and rash.  Neurological:  Negative for seizures, syncope, weakness, light-headedness and numbness.  Psychiatric/Behavioral:  The patient is nervous/anxious.   All other systems reviewed and are negative.   Physical Exam Triage Vital Signs ED Triage Vitals  Enc Vitals Group     BP 11/18/20 1541 (S) (!) 166/113     Pulse Rate 11/18/20 1541 72     Resp 11/18/20 1541 16     Temp 11/18/20 1541 (!) 97.3 F (36.3 C)     Temp Source 11/18/20 1541 Oral     SpO2 11/18/20 1541 99 %     Weight --      Height --      Head Circumference --      Peak Flow --      Pain Score 11/18/20 1544 8     Pain Loc --      Pain Edu? --      Excl. in [place]? --    No data found.  Updated Vital Signs BP (S) (!) 152/104 (BP Location: Left Arm)   Pulse 72   Temp (!) 97.3 F (36.3 C) (Oral)   Resp 16   LMP 12/09/2014   SpO2 99%   Visual Acuity Right Eye Distance:   Left Eye Distance:   Bilateral Distance:    Right Eye Near:   Left Eye Near:    Bilateral Near:     Physical Exam Vitals and nursing note reviewed.  Constitutional:      General: She is not in acute distress.    Appearance: She is well-developed.  HENT:     Head: Normocephalic and atraumatic.  Eyes:     Conjunctiva/sclera: Conjunctivae normal.  Cardiovascular:     Rate and Rhythm: Normal rate  and  regular rhythm.     Heart sounds: No murmur heard. Pulmonary:     Effort: Pulmonary effort is normal. No respiratory distress.     Breath sounds: Normal breath sounds.  Abdominal:     Palpations: Abdomen is soft.     Tenderness: There is no abdominal tenderness.  Musculoskeletal:     Cervical back: Neck supple.  Skin:    General: Skin is warm and dry.  Neurological:     Mental Status: She is alert.     UC Treatments / Results  Labs (all labs ordered are listed, but only abnormal results are displayed) Labs Reviewed - No data to display  EKG   Radiology No results found.  Procedures Procedures (including critical care time)  Medications Ordered in UC Medications - No data to display  Initial Impression / Assessment and Plan / UC Course  I have reviewed the triage vital signs and the nursing notes.  Pertinent labs & imaging results that were available during my care of the patient were reviewed by me and considered in my medical decision making (see chart for details).     EKG normal today.  Pt only recently restarted BP medications.  Advised to continue.  She will check BP at home.  Strict return cautions discussed. F/u with PCP.   Flexeril prescribed for muscle spasm.  Recommend ibuprofen, ice, stretching.   Anxiety.  Discussed anxiety reducing techniques.  F/u with PCP.  Final Clinical Impressions(s) / UC Diagnoses   Final diagnoses:  None   Discharge Instructions   None    ED Prescriptions   None    PDMP not reviewed this encounter.   Konrad Felix, PA-C 11/18/20 1648

## 2020-11-18 NOTE — Discharge Instructions (Addendum)
Apply ice to back and neck.  Recommend light stretching.  Can take prescribed muscle relaxer as needed for spasm.  Recommend ibuprofen.   Continue with HTN medications.  Check BP at home.  Keep follow up with primary care.  If you develop chest pain, headache, blurred vision, follow up in the Emergency Department.   Recommend stress reducing practices as discussed.  Keep follow up with Primary Care Physician.

## 2020-11-18 NOTE — ED Triage Notes (Signed)
Patient presents to Urgent Care with complaints of back pain since this morning related to stress. She states she has been working a lot of overtime.

## 2020-11-26 ENCOUNTER — Encounter: Payer: Self-pay | Admitting: Emergency Medicine

## 2020-11-26 ENCOUNTER — Other Ambulatory Visit: Payer: Self-pay

## 2020-11-26 ENCOUNTER — Ambulatory Visit (INDEPENDENT_AMBULATORY_CARE_PROVIDER_SITE_OTHER): Payer: No Typology Code available for payment source | Admitting: Emergency Medicine

## 2020-11-26 VITALS — BP 128/84 | HR 90 | Temp 97.9°F | Ht 65.0 in | Wt 187.0 lb

## 2020-11-26 DIAGNOSIS — E66811 Obesity, class 1: Secondary | ICD-10-CM | POA: Insufficient documentation

## 2020-11-26 DIAGNOSIS — I10 Essential (primary) hypertension: Secondary | ICD-10-CM

## 2020-11-26 DIAGNOSIS — E6609 Other obesity due to excess calories: Secondary | ICD-10-CM | POA: Diagnosis not present

## 2020-11-26 DIAGNOSIS — Z6831 Body mass index (BMI) 31.0-31.9, adult: Secondary | ICD-10-CM | POA: Diagnosis not present

## 2020-11-26 DIAGNOSIS — F4323 Adjustment disorder with mixed anxiety and depressed mood: Secondary | ICD-10-CM | POA: Diagnosis not present

## 2020-11-26 NOTE — Patient Instructions (Signed)

## 2020-11-26 NOTE — Assessment & Plan Note (Signed)
To be evaluated by behavioral health professional tomorrow. Toxic triggers identified and discussed with patient. Coping mechanisms and strategies to deal with stress discussed.

## 2020-11-26 NOTE — Progress Notes (Signed)
Janet Burnett 45 y.o.   Chief Complaint  Patient presents with   Hypertension    Pt would like to discuss stress    HISTORY OF PRESENT ILLNESS: This is a 45 y.o. female with history of hypertension here for follow-up. Presently taking amlodipine 5 mg daily.  Doing well. Has 2 other complaints: 1.  Weight concerns 2.  Stress related to work and also family member in particular her daughter.  Has appointment with behavioral health tomorrow which was arranged through work. No other complaints or medical concerns today.  Hypertension Pertinent negatives include no chest pain, headaches, palpitations or shortness of breath.    Prior to Admission medications   Medication Sig Start Date End Date Taking? Authorizing Provider  amLODipine (NORVASC) 5 MG tablet Take 1 tablet (5 mg total) by mouth daily for 5 days. 11/15/19 11/26/20 Yes Domingo Fuson, Ines Bloomer, MD  Biotin w/ Vitamins C & E (HAIR SKIN & NAILS GUMMIES PO) Take by mouth. Patient not taking: No sig reported    [provider]  BLACK CURRANT SEED OIL PO Take by mouth daily.  Patient not taking: Reported on 11/26/2020    [provider]  cyclobenzaprine (FLEXERIL) 5 MG tablet Take 1 tablet (5 mg total) by mouth 3 (three) times daily as needed for muscle spasms. Patient not taking: Reported on 11/26/2020 11/18/20   Konrad Felix, PA-C  lisinopril-hydrochlorothiazide (ZESTORETIC) 20-12.5 MG tablet Take 1 tablet by mouth daily. Patient not taking: Reported on 11/26/2020 11/15/19   Horald Pollen, MD  Multiple Vitamin (MULTIVITAMIN WITH MINERALS) TABS tablet Take 1 tablet by mouth daily. Patient not taking: Reported on 11/26/2020    [provider]  Probiotic Product (PROBIOTIC DAILY PO) Take by mouth. Patient not taking: Reported on 11/26/2020    [provider]  vitamin B-12 (CYANOCOBALAMIN) 1000 MCG tablet Take 1,000 mcg by mouth daily. Patient not taking: Reported on 11/26/2020    [provider]  vitamin C (ASCORBIC ACID) 250 MG tablet Take 250 mg by mouth daily. Patient not taking: Reported on 11/26/2020    [provider]    No Known Allergies  Patient Active Problem List   Diagnosis Date Noted   Essential hypertension 09/17/2014   Anemia due to chronic blood loss 08/06/2014   Fibroids 07/25/2014    Past Medical History:  Diagnosis Date   Blood transfusion without reported diagnosis 05/13/14   In [place], 2 units transfused    Fibroid    Hypertension    SVD (spontaneous vaginal delivery)    x 2   Trichimoniasis 02/2017    Past Surgical History:  Procedure Laterality Date   ABDOMINAL HYSTERECTOMY  2016   BILATERAL SALPINGECTOMY Bilateral 01/07/2015   Procedure: BILATERAL SALPINGECTOMY;  Surgeon: Donnamae Jude, MD;  Location: [facility];  Service: Gynecology;  Laterality: Bilateral;   EYE SURGERY     as a child lazy eye- right   VAGINAL HYSTERECTOMY N/A 01/07/2015   Procedure: HYSTERECTOMY VAGINAL;  Surgeon: Donnamae Jude, MD;  Location: [facility];  Service: Gynecology;  Laterality: N/A;   WISDOM TOOTH EXTRACTION      Social History   Socioeconomic History   Marital status: Single    Spouse name: Not on file   Number of children: Not on file   Years of education: Not on file   Highest education level: Not on file  Occupational History   Not on file  Tobacco Use   Smoking status: Never   Smokeless tobacco:  Never  Vaping Use   Vaping Use: Never used  Substance and Sexual Activity   Alcohol use: Yes    Alcohol/week: 0.0 standard drinks    Comment: [facility]   Drug use: No   Sexual activity: Not Currently    Birth control/protection: None  Other Topics Concern   Not on file  Social History Narrative   Not on file   Social Determinants of Health   Financial Resource Strain: Not on file  Food Insecurity: Not on file  Transportation Needs: Not on file  Physical Activity: Not on file  Stress: Not on file  Social Connections: Not on  file  Intimate Partner Violence: Not on file    Family History  Problem Relation Age of Onset   Stroke Mother    Asthma Father    Diabetes Maternal Grandmother    Hypertension Maternal Grandmother    Breast cancer Paternal Grandmother      Review of Systems  Constitutional: Negative.  Negative for chills and fever.  HENT: Negative.  Negative for congestion and sore throat.   Respiratory: Negative.  Negative for cough and shortness of breath.   Cardiovascular: Negative.  Negative for chest pain and palpitations.  Gastrointestinal:  Negative for abdominal pain, diarrhea, nausea and vomiting.  Genitourinary: Negative.  Negative for dysuria and hematuria.  Skin: Negative.  Negative for rash.  Neurological:  Negative for dizziness and headaches.  All other systems reviewed and are negative.  Today's Vitals   11/26/20 1358  BP: 128/84  Pulse: 90  Temp: 97.9 F (36.6 C)  TempSrc: Oral  SpO2: 99%  Weight: 187 lb (84.8 kg)  Height: 5\' 5"  (1.651 m)   Body mass index is 31.12 kg/m. Wt Readings from Last 3 Encounters:  11/26/20 187 lb (84.8 kg)  02/12/20 183 lb (83 kg)  11/15/19 197 lb 12.8 oz (89.7 kg)    Physical Exam Vitals reviewed.  Constitutional:      Appearance: Normal appearance.  HENT:     Head: Normocephalic.  Eyes:     Extraocular Movements: Extraocular movements intact.     Pupils: Pupils are equal, round, and reactive to light.  Cardiovascular:     Rate and Rhythm: Normal rate.  Pulmonary:     Effort: Pulmonary effort is normal.  Musculoskeletal:        General: Normal range of motion.     Cervical back: Normal range of motion.  Skin:    General: Skin is warm and dry.     Capillary Refill: Capillary refill takes less than 2 seconds.  Neurological:     General: No focal deficit present.     Mental Status: She is alert and oriented to person, place, and time.  Psychiatric:        Mood and Affect: Mood normal.        Behavior: Behavior normal.      ASSESSMENT & PLAN: A total of 30 minutes was spent with the patient and counseling/coordination of care regarding preparation for this visit, review of most recent office visit notes, review of most recent blood work results, review of all medications, hypertension and cardiovascular risks associated with this condition, education on nutrition and weight reduction, stress management and need for behavioral health assessment tomorrow as scheduled, prognosis, documentation, and need for follow-up.  Essential hypertension Well-controlled hypertension.  Continue amlodipine 5 mg daily. Dietary approaches to stop hypertension discussed.  Situational mixed anxiety and depressive disorder To be evaluated by behavioral health professional tomorrow.  Toxic triggers identified and discussed with patient. Coping mechanisms and strategies to deal with stress discussed.  Class 1 obesity due to excess calories without serious comorbidity with body mass index (BMI) of 31.0 to 31.9 in adult Diet and nutrition discussed.  Advised to decrease amount of daily carbohydrate intake and increase amount of daily physical activity.  Benisha was seen today for hypertension.  Diagnoses and all orders for this visit:  Essential hypertension -     CBC with Differential/Platelet; Future -     Comprehensive metabolic panel; Future -     Lipid panel; Future -     Hemoglobin A1c; Future  Situational mixed anxiety and depressive disorder  Class 1 obesity due to excess calories without serious comorbidity with body mass index (BMI) of 31.0 to 31.9 in adult  Patient Instructions  Hypertension, Adult High blood pressure (hypertension) is when the force of blood pumping through the arteries is too strong. The arteries are the blood vessels that carry blood from the heart throughout the body. Hypertension forces the heart to work harder to pump blood and may cause arteries to become narrow or stiff. Untreated or  uncontrolled hypertension can cause a heart attack, heart failure, a stroke, kidney disease, and otherproblems. A blood pressure reading consists of a higher number over a lower number. Ideally, your blood pressure should be below 120/80. The first ("top") number is called the systolic pressure. It is a measure of the pressure in your arteries as your heart beats. The second ("bottom") number is called the diastolic pressure. It is a measure of the pressure in your arteries as theheart relaxes. What are the causes? The exact cause of this condition is not known. There are some conditions thatresult in or are related to high blood pressure. What increases the risk? Some risk factors for high blood pressure are under your control. The following factors may make you more likely to develop this condition: Smoking. Having type 2 diabetes mellitus, high cholesterol, or both. Not getting enough exercise or physical activity. Being overweight. Having too much fat, sugar, calories, or salt (sodium) in your diet. Drinking too much alcohol. Some risk factors for high blood pressure may be difficult or impossible to change. Some of these factors include: Having chronic kidney disease. Having a family history of high blood pressure. Age. Risk increases with age. Race. You may be at higher risk if you are African American. Gender. Men are at higher risk than women before age 47. After age 59, women are at higher risk than men. Having obstructive sleep apnea. Stress. What are the signs or symptoms? High blood pressure may not cause symptoms. Very high blood pressure (hypertensive crisis) may cause: Headache. Anxiety. Shortness of breath. Nosebleed. Nausea and vomiting. Vision changes. Severe chest pain. Seizures. How is this diagnosed? This condition is diagnosed by measuring your blood pressure while you are seated, with your arm resting on a flat surface, your legs uncrossed, and your feet flat on  the floor. The cuff of the blood pressure monitor will be placed directly against the skin of your upper arm at the level of your heart. It should be measured at least twice using the same arm. Certain conditions cancause a difference in blood pressure between your right and left arms. Certain factors can cause blood pressure readings to be lower or higher than normal for a short period of time: When your blood pressure is higher when you are in a health care provider's office than when  you are at home, this is called white coat hypertension. Most people with this condition do not need medicines. When your blood pressure is higher at home than when you are in a health care provider's office, this is called masked hypertension. Most people with this condition may need medicines to control blood pressure. If you have a high blood pressure reading during one visit or you have normal blood pressure with other risk factors, you may be asked to: Return on a different day to have your blood pressure checked again. Monitor your blood pressure at home for 1 week or longer. If you are diagnosed with hypertension, you may have other blood or imaging tests to help your health care provider understand your overall risk for otherconditions. How is this treated? This condition is treated by making healthy lifestyle changes, such as eating healthy foods, exercising more, and reducing your alcohol intake. Your health care provider may prescribe medicine if lifestyle changes are not enough to get your blood pressure under control, and if: Your systolic blood pressure is above 130. Your diastolic blood pressure is above 80. Your personal target blood pressure may vary depending on your medicalconditions, your age, and other factors. Follow these instructions at home: Eating and drinking  Eat a diet that is high in fiber and potassium, and low in sodium, added sugar, and fat. An example eating plan is called the DASH  (Dietary Approaches to Stop Hypertension) diet. To eat this way: Eat plenty of fresh fruits and vegetables. Try to fill one half of your plate at each meal with fruits and vegetables. Eat whole grains, such as whole-wheat pasta, brown rice, or whole-grain bread. Fill about one fourth of your plate with whole grains. Eat or drink low-fat dairy products, such as skim milk or low-fat yogurt. Avoid fatty cuts of meat, processed or cured meats, and poultry with skin. Fill about one fourth of your plate with lean proteins, such as fish, chicken without skin, beans, eggs, or tofu. Avoid pre-made and processed foods. These tend to be higher in sodium, added sugar, and fat. Reduce your daily sodium intake. Most people with hypertension should eat less than 1,500 mg of sodium a day. Do not drink alcohol if: Your health care provider tells you not to drink. You are pregnant, may be pregnant, or are planning to become pregnant. If you drink alcohol: Limit how much you use to: 0-1 drink a day for women. 0-2 drinks a day for men. Be aware of how much alcohol is in your drink. In the [place] one drink equals one 12 oz bottle of beer (355 mL), one 5 oz glass of wine (148 mL), or one 1 oz glass of hard liquor (44 mL).  Lifestyle  Work with your health care provider to maintain a healthy body weight or to lose weight. Ask what an ideal weight is for you. Get at least 30 minutes of exercise most days of the week. Activities may include walking, swimming, or biking. Include exercise to strengthen your muscles (resistance exercise), such as Pilates or lifting weights, as part of your weekly exercise routine. Try to do these types of exercises for 30 minutes at least 3 days a week. Do not use any products that contain nicotine or tobacco, such as cigarettes, e-cigarettes, and chewing tobacco. If you need help quitting, ask your health care provider. Monitor your blood pressure at home as told by your health care  provider. Keep all follow-up visits as told by your health  care provider. This is important.  Medicines Take over-the-counter and prescription medicines only as told by your health care provider. Follow directions carefully. Blood pressure medicines must be taken as prescribed. Do not skip doses of blood pressure medicine. Doing this puts you at risk for problems and can make the medicine less effective. Ask your health care provider about side effects or reactions to medicines that you should watch for. Contact a health care provider if you: Think you are having a reaction to a medicine you are taking. Have headaches that keep coming back (recurring). Feel dizzy. Have swelling in your ankles. Have trouble with your vision. Get help right away if you: Develop a severe headache or confusion. Have unusual weakness or numbness. Feel faint. Have severe pain in your chest or abdomen. Vomit repeatedly. Have trouble breathing. Summary Hypertension is when the force of blood pumping through your arteries is too strong. If this condition is not controlled, it may put you at risk for serious complications. Your personal target blood pressure may vary depending on your medical conditions, your age, and other factors. For most people, a normal blood pressure is less than 120/80. Hypertension is treated with lifestyle changes, medicines, or a combination of both. Lifestyle changes include losing weight, eating a healthy, low-sodium diet, exercising more, and limiting alcohol. This information is not intended to replace advice given to you by your health care provider. Make sure you discuss any questions you have with your healthcare provider. Document Revised: 01/18/2018 Document Reviewed: 01/18/2018 Elsevier Patient Education  2022 [place], MD [facility]

## 2020-11-26 NOTE — Assessment & Plan Note (Signed)
Diet and nutrition discussed.  Advised to decrease amount of daily carbohydrate intake and increase amount of daily physical activity.

## 2020-11-26 NOTE — Assessment & Plan Note (Signed)
Well-controlled hypertension.  Continue amlodipine 5 mg daily. Dietary approaches to stop hypertension discussed.

## 2020-12-01 ENCOUNTER — Emergency Department (HOSPITAL_COMMUNITY)
Admission: EM | Admit: 2020-12-01 | Discharge: 2020-12-02 | Disposition: A | Discharge status: Home / Self Care | Payer: No Typology Code available for payment source | Attending: Emergency Medicine | Admitting: Emergency Medicine

## 2020-12-01 DIAGNOSIS — M79661 Pain in right lower leg: Secondary | ICD-10-CM | POA: Diagnosis not present

## 2020-12-01 DIAGNOSIS — Z5321 Procedure and treatment not carried out due to patient leaving prior to being seen by health care provider: Secondary | ICD-10-CM | POA: Insufficient documentation

## 2020-12-01 NOTE — ED Triage Notes (Signed)
Pt also complains of knot on R shoulder, been there for "months."

## 2020-12-01 NOTE — ED Provider Notes (Signed)
Emergency Medicine Provider Triage Evaluation Note  Janet Burnett , a 45 y.o. female  was evaluated in triage.  Pt complains of left lower leg pain. States her sx started about 2-3 months ago but worsened last night. 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 No CP, SOB, fevers, n/v. Pt also notes a nodule to her right anterior shoulder that has been present for months. Denies any pain.    Physical Exam  BP (!) 152/94 (BP Location: Left Arm)   Pulse 69   Temp 98.4 F (36.9 C)   Resp 18   LMP 12/09/2014   SpO2 100%  Gen:   Awake, no distress   Resp:  Normal effort  MSK:   Moves extremities without difficulty  Other:  Non tender nodule to the right anterior shoulder. Possible LDA in the region. No other palpable nodes. Mild TTP diffusely in the left calf. No palpable cords. No edema in the left leg.   Medical Decision Making  Medically screening exam initiated at 8:48 PM.  Appropriate orders placed.  Janet Burnett was informed that the remainder of the evaluation will be completed by another provider, this initial triage assessment does not replace that evaluation, and the importance of remaining in the ED until their evaluation is complete.   Rayna Sexton, PA-C 12/01/20 2050    Lucrezia Starch, MD 12/05/20 1116

## 2020-12-01 NOTE — ED Triage Notes (Signed)
Pt c/o aching/tight L leg, worst in knee since last night. Ambulatory but favors L leg. Denies injury Hx HTN but compliant w medications

## 2020-12-02 ENCOUNTER — Other Ambulatory Visit: Payer: Self-pay

## 2020-12-02 ENCOUNTER — Emergency Department (HOSPITAL_BASED_OUTPATIENT_CLINIC_OR_DEPARTMENT_OTHER)
Admission: EM | Admit: 2020-12-02 | Discharge: 2020-12-02 | Disposition: A | Discharge status: Home / Self Care | Payer: No Typology Code available for payment source | Source: Home / Self Care | Attending: Emergency Medicine | Admitting: Emergency Medicine

## 2020-12-02 ENCOUNTER — Encounter (HOSPITAL_BASED_OUTPATIENT_CLINIC_OR_DEPARTMENT_OTHER): Payer: Self-pay | Admitting: Emergency Medicine

## 2020-12-02 ENCOUNTER — Emergency Department (HOSPITAL_BASED_OUTPATIENT_CLINIC_OR_DEPARTMENT_OTHER): Payer: No Typology Code available for payment source

## 2020-12-02 DIAGNOSIS — M79605 Pain in left leg: Secondary | ICD-10-CM

## 2020-12-02 DIAGNOSIS — I10 Essential (primary) hypertension: Secondary | ICD-10-CM | POA: Insufficient documentation

## 2020-12-02 MED ORDER — DICLOFENAC SODIUM 1 % EX GEL: 2.0000 g | Freq: Four times a day (QID) | CUTANEOUS | 0 refills | Status: AC

## 2020-12-02 MED ORDER — DICLOFENAC SODIUM 1 % EX GEL: 2.0000 g | Freq: Four times a day (QID) | CUTANEOUS | 0 refills | Status: DC

## 2020-12-02 NOTE — ED Triage Notes (Signed)
Pt arrives to ED with c/o of left lower leg pain x2-3 months. Worsened x2 days ago.

## 2020-12-02 NOTE — ED Provider Notes (Signed)
Emergency Department Provider Note   I have reviewed the triage vital signs and the nursing notes.   HISTORY  Chief Complaint Leg Pain   HPI Janet Burnett is a 45 y.o. female with past medical history reviewed below presents to the emergency department with left leg pain.  Symptoms have been progressively worsening over the past several weeks to months.  She notes worsening pain over the past several days.  She went to urgent care looking for possible ultrasound yesterday but they did not have this capability.  She tried going to the emergency department but the wait was very [place].  She denies any injury.  No fevers or chills.  No joint swelling.  Pain is worse in the knee both in the front of the knee as well as behind the knee.  She has not noticed leg swelling.  She has been researching this discomfort and became concerned for possible blood clots.  She is not having chest pain or shortness of breath.   Past Medical History:  Diagnosis Date   Blood transfusion without reported diagnosis 05/13/14   In [place], 2 units transfused    Fibroid    Hypertension    SVD (spontaneous vaginal delivery)    x 2   Trichimoniasis 02/2017    Patient Active Problem List   Diagnosis Date Noted   Situational mixed anxiety and depressive disorder 11/26/2020   Class 1 obesity due to excess calories without serious comorbidity with body mass index (BMI) of 31.0 to 31.9 in adult 11/26/2020   Essential hypertension 09/17/2014   Anemia due to chronic blood loss 08/06/2014   Fibroids 07/25/2014    Past Surgical History:  Procedure Laterality Date   ABDOMINAL HYSTERECTOMY  2016   BILATERAL SALPINGECTOMY Bilateral 01/07/2015   Procedure: BILATERAL SALPINGECTOMY;  Surgeon: Donnamae Jude, MD;  Location: [facility];  Service: Gynecology;  Laterality: Bilateral;   EYE SURGERY     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  Family History  Problem Relation Age of Onset   Stroke Mother    Asthma Father    Diabetes Maternal Grandmother    Hypertension Maternal Grandmother    Breast cancer Paternal Grandmother     Social History Social History   Tobacco Use   Smoking status: Never   Smokeless tobacco: Never  Vaping Use   Vaping Use: Never used  Substance Use Topics   Alcohol use: Yes    Alcohol/week: 0.0 standard drinks    Comment: WINE OCC   Drug use: No    Review of Systems  Constitutional: No fever/chills Cardiovascular: Denies chest pain. Respiratory: Denies shortness of breath. Gastrointestinal: No abdominal pain.  Genitourinary: Negative for dysuria. Musculoskeletal: Negative for back pain. Positive left leg pain.  Skin: Negative for rash. Neurological: Negative for focal weakness or numbness.  ____________________________________________   PHYSICAL EXAM:  VITAL SIGNS: ED Triage Vitals  Enc Vitals Group     BP 12/02/20 1045 (!) 142/99     Pulse Rate 12/02/20 1045 78     Resp 12/02/20 1045 16     Temp 12/02/20 1045 98.3 F (36.8 C)     Temp Source 12/02/20 1045 Oral     SpO2 12/02/20 1045 100 %  Constitutional: Alert and oriented. Well appearing and in no acute distress. Eyes: Conjunctivae are normal.  Head: Atraumatic. Nose: No congestion/rhinnorhea. Mouth/Throat: Mucous membranes are moist. Neck: No stridor.  Cardiovascular: Normal rate, regular rhythm. Good peripheral circulation. Grossly normal heart sounds. 2+ DP and PT pulses on the left.  Respiratory: Normal respiratory effort.  Gastrointestinal: No distention.  Musculoskeletal: Patient with mild tenderness in the popliteal fossa on the left.  No cellulitis or palpable masses.  No leg swelling.  Normal range of motion of joints.  No knee redness, warmth, swelling.   Neurologic:  Normal speech and language. No gross focal neurologic deficits are appreciated.  Skin:  Skin is warm, dry and intact. No rash noted. ____________________________________________  RADIOLOGY  US Venous Img Lower  Left (DVT Study)  Result Date: 12/02/2020 CLINICAL DATA:  Leg pain EXAM: LEFT LOWER EXTREMITY VENOUS DOPPLER ULTRASOUND TECHNIQUE: Gray-scale sonography with compression, as well as color and duplex ultrasound, were performed to evaluate the deep venous system(s) from the level of the common femoral vein through the popliteal and proximal calf veins. COMPARISON:  None. FINDINGS: VENOUS Normal compressibility of the common femoral, superficial femoral, and popliteal veins, as well as the visualized calf veins. Visualized portions of profunda femoral vein and great saphenous vein unremarkable. No filling defects to suggest DVT on grayscale or color Doppler imaging. Doppler waveforms show normal direction of venous flow, normal respiratory plasticity and response to augmentation. Limited views of the contralateral common femoral vein are unremarkable. OTHER None. Limitations: none IMPRESSION: No left lower extremity DVT Electronically Signed   By: Miachel Roux M.D.   On: 12/02/2020 12:09    ____________________________________________   PROCEDURES  Procedure(s) performed:   Procedures  None  ____________________________________________   INITIAL IMPRESSION / ASSESSMENT AND PLAN / ED COURSE  Pertinent labs & imaging results that were available during my care of the patient were reviewed by me and considered in my medical decision making (see chart for details).   Patient presents the emergency department with acute to subacute left leg pain.  Some located behind the left knee.  She has normal pulses and sensation in the foot.  No injury.  The knee does not appear septic.  Question some arthritis but patient is not interested in x-ray.  She is concerned for DVT.  Given the  discomfort and behind the knee I have ordered a DVT ultrasound which did not show evidence of clot.  Plan for Voltaren gel along with Tylenol/Motrin.  Provided contact information for sports medicine for outpatient follow-up.   ____________________________________________  FINAL CLINICAL IMPRESSION(S) / ED DIAGNOSES  Final diagnoses:  Left leg pain    NEW OUTPATIENT MEDICATIONS STARTED DURING THIS VISIT:  New Prescriptions   DICLOFENAC SODIUM (VOLTAREN) 1 % GEL    Apply 2 g topically 4 (four) times daily.    Note:  This document was prepared using Dragon voice recognition software and may include unintentional dictation errors.  Nanda Quinton, MD, [facility] Emergency Medicine    Oneka Parada, [facility], MD 12/02/20 [phone]

## 2020-12-02 NOTE — Discharge Instructions (Addendum)

## 2020-12-02 NOTE — ED Notes (Signed)
Pt discharged home after verbalizing understanding of discharge instructions; nad noted. 

## 2020-12-24 ENCOUNTER — Telehealth: Payer: Self-pay | Admitting: Emergency Medicine

## 2020-12-24 NOTE — Telephone Encounter (Signed)
FMLA paperwork can be found in Media tab 8.2.22

## 2020-12-30 NOTE — Telephone Encounter (Signed)
Pt FMLA forms were placed in Dr. Barry Brunner box for review and sign.

## 2020-12-30 NOTE — Telephone Encounter (Signed)
Paperwork signed.  Thanks.

## 2020-12-31 NOTE — Telephone Encounter (Signed)
Completed FMLA forms, faxed forms to pt's job. Called and updated pt.

## 2021-04-17 ENCOUNTER — Other Ambulatory Visit: Payer: Self-pay | Admitting: Emergency Medicine

## 2021-04-17 DIAGNOSIS — I10 Essential (primary) hypertension: Secondary | ICD-10-CM

## 2021-10-12 ENCOUNTER — Other Ambulatory Visit (INDEPENDENT_AMBULATORY_CARE_PROVIDER_SITE_OTHER): Payer: No Typology Code available for payment source

## 2021-10-12 ENCOUNTER — Other Ambulatory Visit: Payer: No Typology Code available for payment source

## 2021-10-12 DIAGNOSIS — I10 Essential (primary) hypertension: Secondary | ICD-10-CM

## 2021-10-12 LAB — COMPREHENSIVE METABOLIC PANEL
ALT: 12 U/L (ref 0–35)
AST: 15 U/L (ref 0–37)
Albumin: 4.3 g/dL (ref 3.5–5.2)
Alkaline Phosphatase: 84 U/L (ref 39–117)
BUN: 12 mg/dL (ref 6–23)
CO2: 31 mEq/L (ref 19–32)
Calcium: 9.3 mg/dL (ref 8.4–10.5)
Chloride: 104 mEq/L (ref 96–112)
Creatinine, Ser: 0.95 mg/dL (ref 0.40–1.20)
GFR: 71.01 mL/min (ref >60.00)
Glucose, Bld: 92 mg/dL (ref 70–99)
Potassium: 3.5 mEq/L (ref 3.5–5.1)
Sodium: 138 mEq/L (ref 135–145)
Total Bilirubin: 0.3 mg/dL (ref 0.2–1.2)
Total Protein: 7.3 g/dL (ref 6.0–8.3)

## 2021-10-12 LAB — CBC WITH DIFFERENTIAL/PLATELET
Basophils Absolute: 0.1 10*3/uL (ref 0.0–0.1)
Basophils Relative: 1 % (ref 0.0–3.0)
Eosinophils Absolute: 0.1 10*3/uL (ref 0.0–0.7)
Eosinophils Relative: 1.7 % (ref 0.0–5.0)
HCT: 40.6 % (ref 36.0–46.0)
Hemoglobin: 13.7 g/dL (ref 12.0–15.0)
Lymphocytes Relative: 43.9 % (ref 12.0–46.0)
Lymphs Abs: 2.3 10*3/uL (ref 0.7–4.0)
MCHC: 33.6 g/dL (ref 30.0–36.0)
MCV: 84.5 fl (ref 78.0–100.0)
Monocytes Absolute: 0.4 10*3/uL (ref 0.1–1.0)
Monocytes Relative: 8.4 % (ref 3.0–12.0)
Neutro Abs: 2.4 10*3/uL (ref 1.4–7.7)
Neutrophils Relative %: 45 % (ref 43.0–77.0)
Platelets: 270 10*3/uL (ref 150.0–400.0)
RBC: 4.8 Mil/uL (ref 3.87–5.11)
RDW: 14 % (ref 11.5–15.5)
WBC: 5.3 10*3/uL (ref 4.0–10.5)

## 2021-10-12 LAB — LIPID PANEL
Cholesterol: 136 mg/dL (ref 0–200)
HDL: 50.6 mg/dL (ref >39.00)
LDL Cholesterol: 70 mg/dL (ref 0–99)
NonHDL: 85.45
Total CHOL/HDL Ratio: 3
Triglycerides: 75 mg/dL (ref 0.0–149.0)
VLDL: 15 mg/dL (ref 0.0–40.0)

## 2021-10-12 LAB — HEMOGLOBIN A1C: Hgb A1c MFr Bld: 5.9 % (ref 4.6–6.5)

## 2021-10-13 ENCOUNTER — Encounter: Payer: Self-pay | Admitting: Emergency Medicine

## 2021-10-13 ENCOUNTER — Ambulatory Visit: Payer: No Typology Code available for payment source | Admitting: Emergency Medicine

## 2021-10-13 VITALS — BP 150/96 | HR 77 | Temp 98.3°F | Ht 65.0 in | Wt 193.0 lb

## 2021-10-13 DIAGNOSIS — M790 Rheumatism, unspecified: Secondary | ICD-10-CM | POA: Diagnosis not present

## 2021-10-13 DIAGNOSIS — I10 Essential (primary) hypertension: Secondary | ICD-10-CM

## 2021-10-13 MED ORDER — AMLODIPINE BESYLATE 5 MG PO TABS: 5.0000 mg | ORAL_TABLET | Freq: Every day | ORAL | 3 refills | Status: AC

## 2021-10-13 NOTE — Patient Instructions (Signed)

## 2021-10-13 NOTE — Progress Notes (Signed)
Janet Burnett 46 y.o.   Chief Complaint  Patient presents with  . Pain    Achy pain throughout, x 1 week     HISTORY OF PRESENT ILLNESS: This is a 46 y.o. female complaining of ***. ***.  HPI   Prior to Admission medications   Medication Sig Start Date End Date Taking? Authorizing Provider  amLODipine (NORVASC) 5 MG tablet TAKE 1 TABLET(5 MG) BY MOUTH DAILY Patient not taking: Reported on 10/13/2021 04/17/21   Horald Pollen, MD  cyclobenzaprine (FLEXERIL) 5 MG tablet Take 1 tablet (5 mg total) by mouth 3 (three) times daily as needed for muscle spasms. Patient not taking: Reported on 11/26/2020 11/18/20   Ward, Lenise Arena, PA-C  diclofenac Sodium (VOLTAREN) 1 % GEL Apply 2 g topically 4 (four) times daily. Patient not taking: Reported on 10/13/2021 12/02/20   Margette Fast, MD  lisinopril-hydrochlorothiazide (ZESTORETIC) 20-12.5 MG tablet Take 1 tablet by mouth daily. Patient not taking: Reported on 11/26/2020 11/15/19   Horald Pollen, MD    No Known Allergies  Patient Active Problem List   Diagnosis Date Noted  . Situational mixed anxiety and depressive disorder 11/26/2020  . Class 1 obesity due to excess calories without serious comorbidity with body mass index (BMI) of 31.0 to 31.9 in adult 11/26/2020  . Essential hypertension 09/17/2014  . Anemia due to chronic blood loss 08/06/2014  . Fibroids 07/25/2014    Past Medical History:  Diagnosis Date  . Blood transfusion without reported diagnosis 05/13/14   In [place], 2 units transfused   . Fibroid   . Hypertension   . SVD (spontaneous vaginal delivery)    x 2  . Trichimoniasis 02/2017    Past Surgical History:  Procedure Laterality Date  . ABDOMINAL HYSTERECTOMY  2016  . BILATERAL SALPINGECTOMY Bilateral 01/07/2015   Procedure: BILATERAL SALPINGECTOMY;  Surgeon: Donnamae Jude, MD;  Location: [facility];  Service: Gynecology;  Laterality: Bilateral;  . 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  VAGINAL HYSTERECTOMY N/A 01/07/2015   Procedure: HYSTERECTOMY VAGINAL;  Surgeon: Donnamae Jude, MD;  Location: [facility];  Service: Gynecology;  Laterality: N/A;  . WISDOM TOOTH EXTRACTION      Social History   Socioeconomic History  . Marital status: Single    Spouse name: Not on file  . Number of children: Not on file  . Years of education: Not on file  . Highest education level: Not on file  Occupational History  . Not on file  Tobacco Use  . Smoking status: Never  . Smokeless tobacco: Never  Vaping Use  . Vaping Use: Never used  Substance and Sexual Activity  . Alcohol use: Yes    Alcohol/week: 0.0 standard drinks    Comment: WINE OCC  . Drug use: No  . Sexual activity: Not Currently    Birth control/protection: None  Other Topics Concern  . Not on file  Social History Narrative  . Not on file   Social Determinants of Health   Financial Resource Strain: Not on file  Food Insecurity: Not on file  Transportation Needs: Not on file  Physical Activity: Not on file  Stress: Not on file  Social Connections: Not on file  Intimate Partner Violence: Not on file    Family History  Problem Relation Age of Onset  . Stroke Mother   . Asthma Father   . Diabetes Maternal Grandmother   . Hypertension Maternal Grandmother   .  Breast cancer Paternal Grandmother      ROS   Physical Exam   ASSESSMENT & PLAN: ***   Agustina Caroli, MD [facility]

## 2021-10-14 DIAGNOSIS — M790 Rheumatism, unspecified: Secondary | ICD-10-CM | POA: Insufficient documentation

## 2021-10-14 NOTE — Assessment & Plan Note (Signed)
Normal recent blood work.  No anemia.  No diabetes.  Normal kidney function.  Twin sister recently diagnosed with lupus. May have SLE herself.  Additional blood work done today.  May need referral to rheumatology.

## 2021-10-14 NOTE — Assessment & Plan Note (Signed)
Elevated blood pressure readings in the office. Not taking medication at present time. Advised to start taking amlodipine 5 mg daily.  May also need to start taking Hyzaar as before.  Advised to monitor blood pressure readings at home for the next several weeks and keep a log. Diet and nutrition discussed as well as need to exercise more.

## 2022-07-25 IMAGING — US US EXTREM LOW VENOUS*L*
1 series · 14 of 24 positions shown · non-contrast
Comparison: None.

CLINICAL DATA: Leg pain

EXAM:
LEFT LOWER EXTREMITY VENOUS DOPPLER ULTRASOUND
TECHNIQUE: Gray-scale sonography with compression, as well as color and duplex
ultrasound, were performed to evaluate the deep venous system(s)
from the level of the common femoral vein through the popliteal and
proximal calf veins.

[Series 1: us venous img lower uni left (dvt) · portal-venous · 14 of 45 slices shown]
[im 1/45]
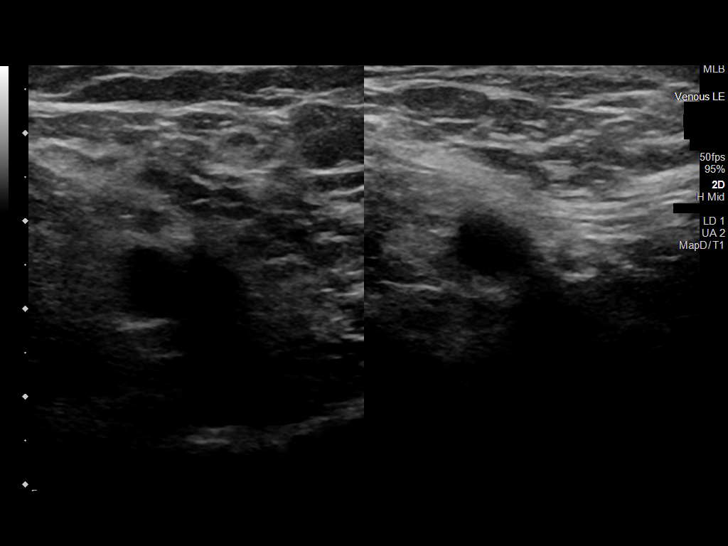
[im 4/45]
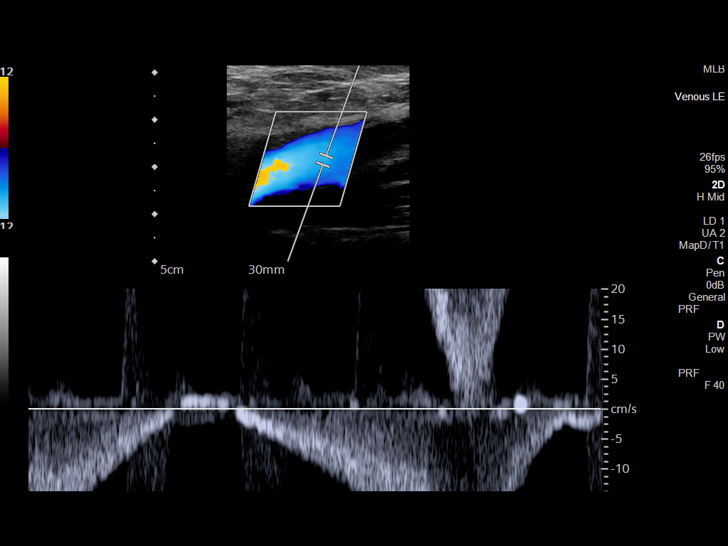
[im 8/45]
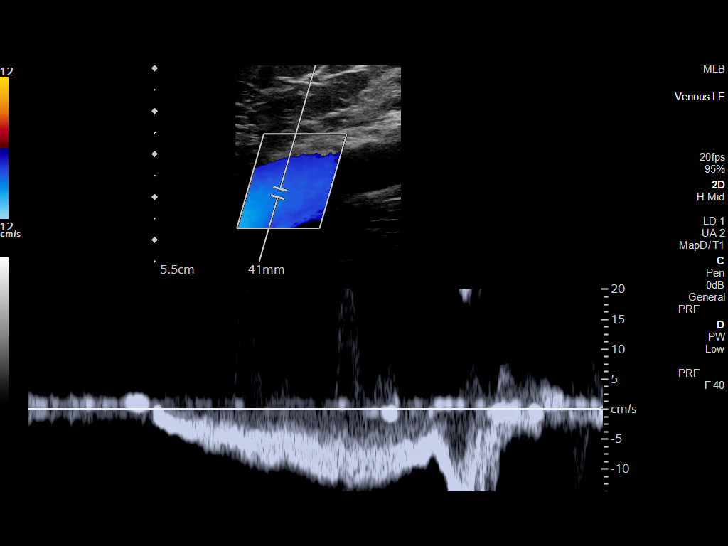
[im 12/45]
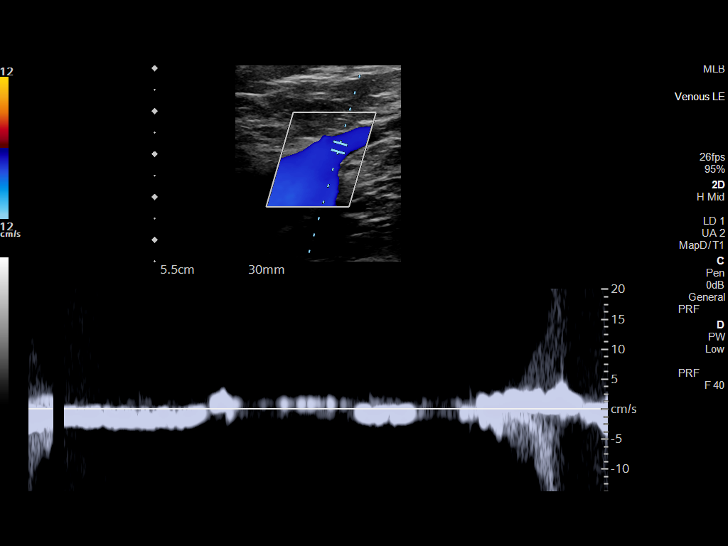
[im 14/45]
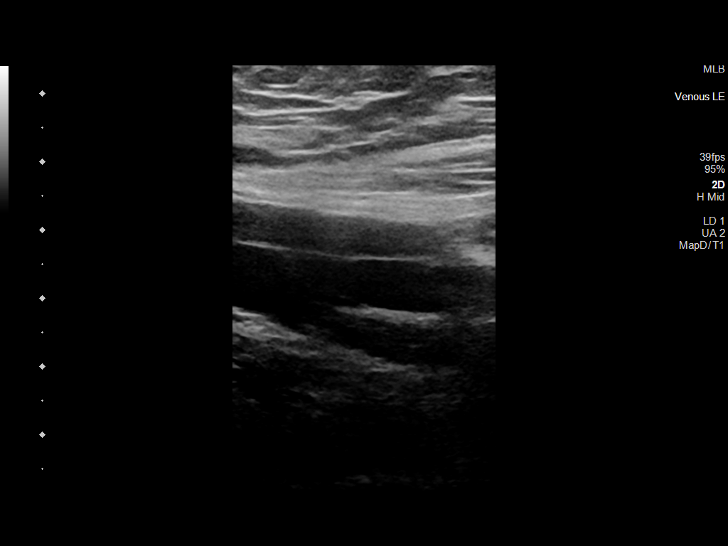
[im 18/45]
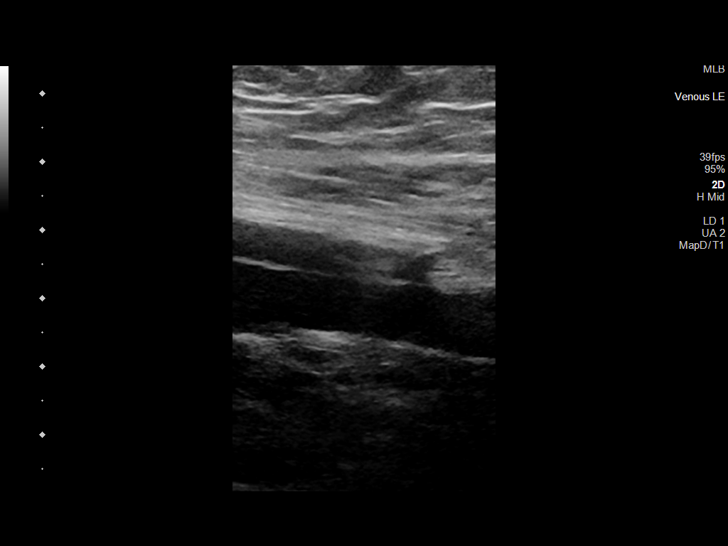
[im 22/45]
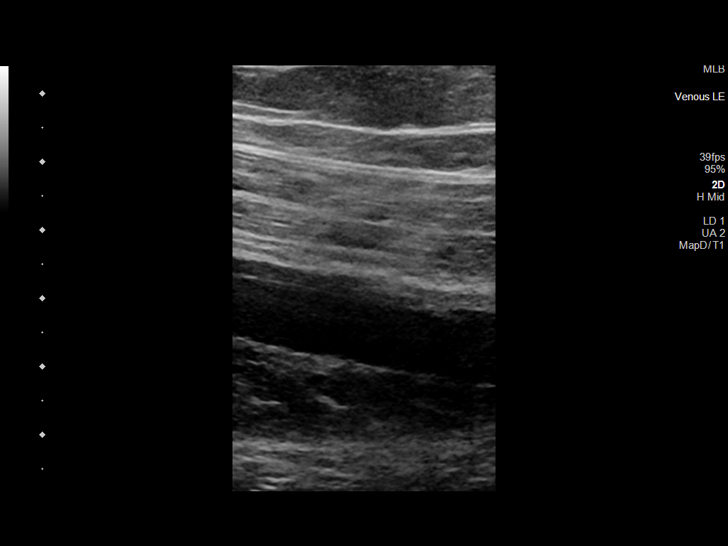
[im 23/45]
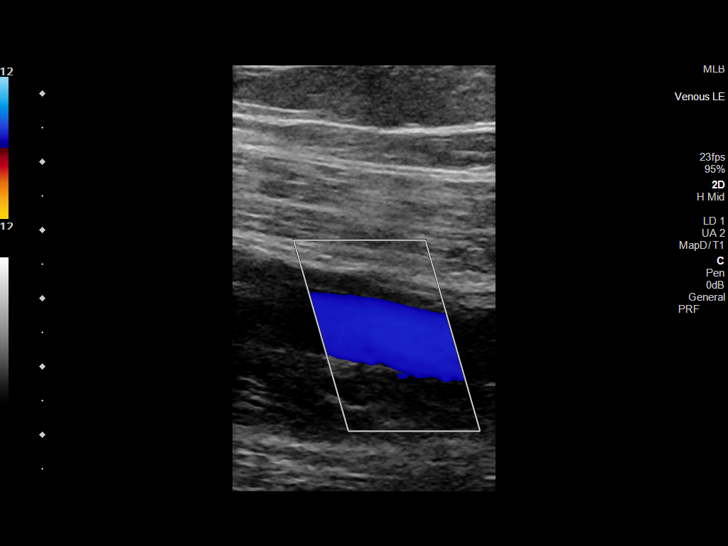
[im 27/45]
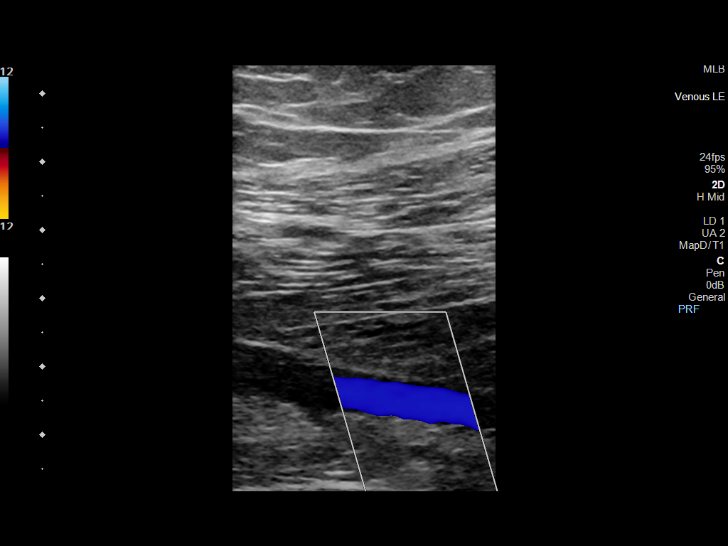
[im 31/45]
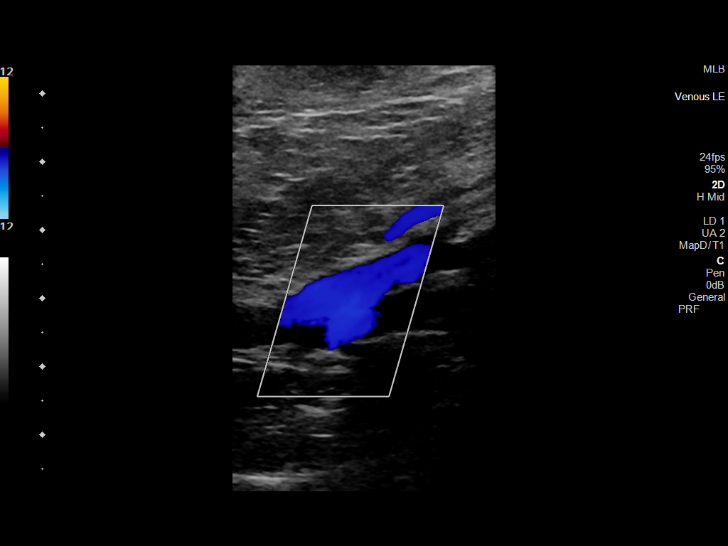
[im 35/45]
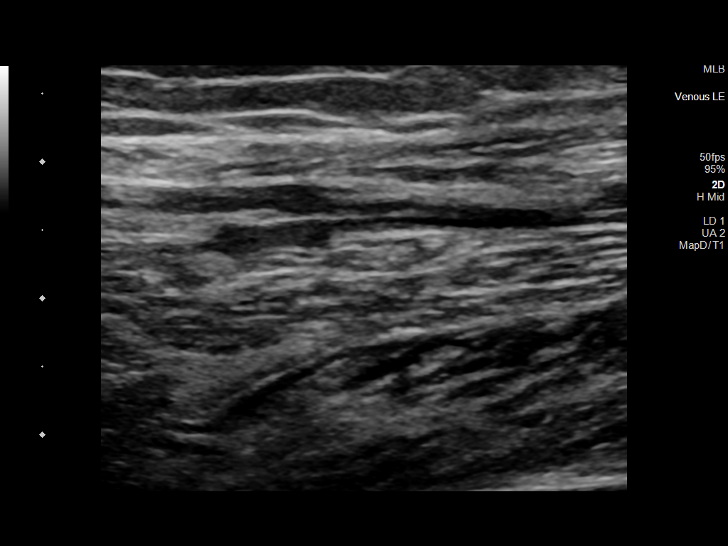
[im 37/45]
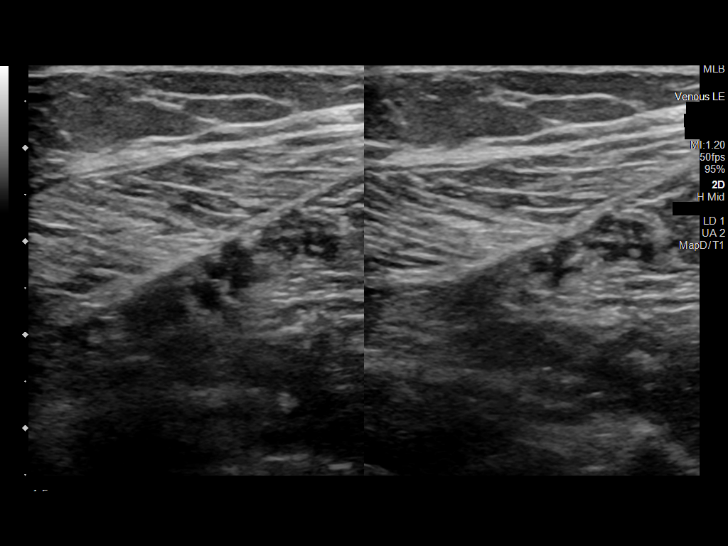
[im 41/45]
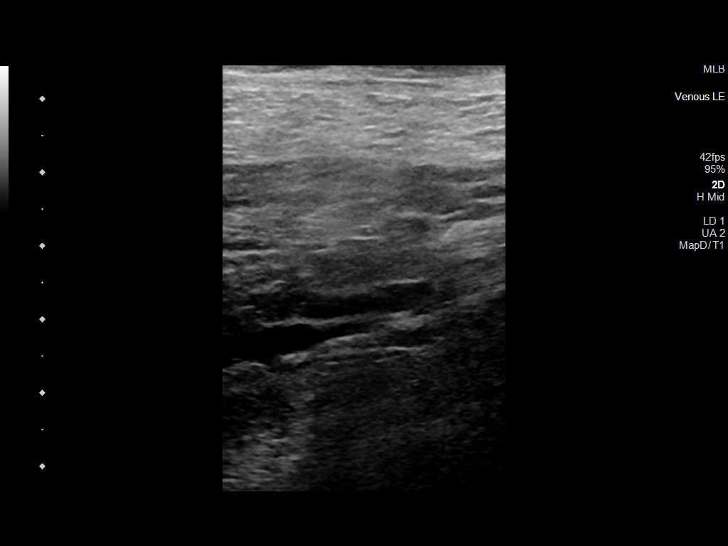
[im 45/45]
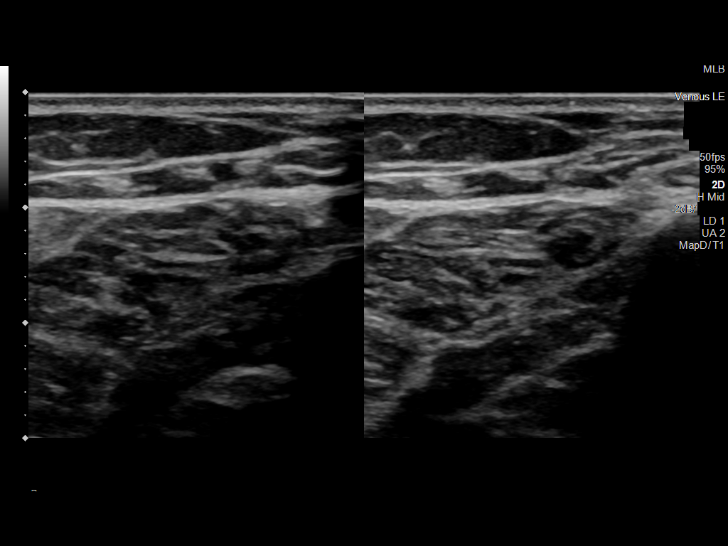

[14 of 24 positions shown; findings below may reference images not displayed]

FINDINGS: VENOUS

Normal compressibility of the common femoral, superficial femoral,
and popliteal veins, as well as the visualized calf veins.
Visualized portions of profunda femoral vein and great saphenous
vein unremarkable. No filling defects to suggest DVT on grayscale or
color Doppler imaging. Doppler waveforms show normal direction of
venous flow, normal respiratory plasticity and response to
augmentation.

Limited views of the contralateral common femoral vein are
unremarkable.

OTHER

None.

Limitations: none
IMPRESSION: No left lower extremity DVT
